# Patient Record
Sex: Female | Born: 1970
Health system: Southern US, Community
[De-identification: ages and names within clinical notes are randomized; demographics above are authoritative.]

## PROBLEM LIST (undated history)

## (undated) DIAGNOSIS — M7752 Other enthesopathy of left foot: Secondary | ICD-10-CM

## (undated) DIAGNOSIS — N946 Dysmenorrhea, unspecified: Secondary | ICD-10-CM

## (undated) DIAGNOSIS — Z8679 Personal history of other diseases of the circulatory system: Secondary | ICD-10-CM

## (undated) DIAGNOSIS — H15009 Unspecified scleritis, unspecified eye: Secondary | ICD-10-CM

## (undated) DIAGNOSIS — D219 Benign neoplasm of connective and other soft tissue, unspecified: Secondary | ICD-10-CM

## (undated) DIAGNOSIS — T7840XA Allergy, unspecified, initial encounter: Secondary | ICD-10-CM

## (undated) DIAGNOSIS — H15103 Unspecified episcleritis, bilateral: Secondary | ICD-10-CM

## (undated) HISTORY — DX: Dysmenorrhea, unspecified: N94.6

## (undated) HISTORY — DX: Benign neoplasm of connective and other soft tissue, unspecified: D21.9

## (undated) HISTORY — PX: OTHER SURGICAL HISTORY: SHX169

## (undated) HISTORY — DX: Unspecified scleritis, unspecified eye: H15.009

## (undated) HISTORY — DX: Allergy, unspecified, initial encounter: T78.40XA

## (undated) HISTORY — DX: Other enthesopathy of left foot and ankle: M77.52

## (undated) HISTORY — DX: Personal history of other diseases of the circulatory system: Z86.79

## (undated) HISTORY — DX: Unspecified episcleritis, bilateral: H15.103

---

## 2001-03-28 ENCOUNTER — Encounter: Payer: Self-pay | Admitting: Emergency Medicine

## 2001-03-28 ENCOUNTER — Emergency Department (HOSPITAL_COMMUNITY): Admission: EM | Admit: 2001-03-28 | Discharge: 2001-03-28 | Payer: Self-pay | Admitting: Emergency Medicine

## 2001-03-29 ENCOUNTER — Ambulatory Visit (HOSPITAL_COMMUNITY): Admission: RE | Admit: 2001-03-29 | Discharge: 2001-03-29 | Payer: Self-pay | Admitting: Orthopedic Surgery

## 2001-03-29 ENCOUNTER — Encounter: Payer: Self-pay | Admitting: Orthopedic Surgery

## 2001-05-06 ENCOUNTER — Ambulatory Visit (HOSPITAL_BASED_OUTPATIENT_CLINIC_OR_DEPARTMENT_OTHER): Admission: RE | Admit: 2001-05-06 | Discharge: 2001-05-06 | Payer: Self-pay | Admitting: Orthopedic Surgery

## 2005-02-14 ENCOUNTER — Other Ambulatory Visit: Admission: RE | Admit: 2005-02-14 | Discharge: 2005-02-14 | Payer: Self-pay | Admitting: Family Medicine

## 2006-08-13 ENCOUNTER — Encounter: Admission: RE | Admit: 2006-08-13 | Discharge: 2006-08-13 | Payer: Self-pay | Admitting: Occupational Medicine

## 2006-12-01 ENCOUNTER — Other Ambulatory Visit: Admission: RE | Admit: 2006-12-01 | Discharge: 2006-12-01 | Payer: Self-pay | Admitting: Family Medicine

## 2010-07-10 ENCOUNTER — Encounter: Admission: RE | Admit: 2010-07-10 | Discharge: 2010-07-10 | Payer: Self-pay | Admitting: Obstetrics and Gynecology

## 2010-08-26 DIAGNOSIS — I82409 Acute embolism and thrombosis of unspecified deep veins of unspecified lower extremity: Secondary | ICD-10-CM

## 2010-08-26 HISTORY — DX: Acute embolism and thrombosis of unspecified deep veins of unspecified lower extremity: I82.409

## 2010-09-17 ENCOUNTER — Encounter: Payer: Self-pay | Admitting: Obstetrics and Gynecology

## 2011-01-11 NOTE — Op Note (Signed)
[place]  Patient:    Joyce Zhang, Joyce Zhang Visit Number: [id number] MRN: [id number]          Service Type: DSU Location: [facility] Attending Physician:  Georgena Spurling Dictated by:   Georgena Spurling, M.D. Proc. Date: 05/06/01 Admit Date:  05/06/2001                             Operative Report  PREOPERATIVE DIAGNOSIS:  Retained hardware, left index finger.  POSTOPERATIVE DIAGNOSIS:  Retained hardware, left index finger.  OPERATION PERFORMED:  Left index finger hardware removal.  SURGEON:  Georgena Spurling, M.D.  ANESTHESIA:  Mask general.  INDICATIONS FOR PROCEDURE:  The patient is a 40 year old black female now over five weeks status post closed reduction and percutaneous pinning of the index finger by another surgeon.  She came to me and wanted me to follow up in her care.  X-ray showed radiographic healing and she wished to have it done in the operating room which I felt was appropriate due to the amount of bone pain experienced with hardware removal.  DESCRIPTION OF PROCEDURE:  The patient was placed supine and administered mask general anesthesia.  The left hand was prepped and draped in the usual sterile fashion.  A pair of pliers was used to remove the pins and the wounds were debrided with sponges and rongeurs free of any eschar.  We then curetted the soft tissue holes and then dressed with Xeroform, dressing sponge and sterile Coban.  The patient tolerated the procedure well.  COMPLICATIONS:  None.  DRAINS:  None.  TOURNIQUET TIME:  None. Dictated by:   Georgena Spurling, M.D. Attending Physician:  Georgena Spurling DD:  05/06/01 TD:  05/06/01 Job: [id number]
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
## 2012-11-18 ENCOUNTER — Other Ambulatory Visit: Payer: Self-pay | Admitting: Obstetrics and Gynecology

## 2012-11-18 DIAGNOSIS — R928 Other abnormal and inconclusive findings on diagnostic imaging of breast: Secondary | ICD-10-CM

## 2012-11-30 ENCOUNTER — Other Ambulatory Visit: Payer: Self-pay

## 2012-12-09 ENCOUNTER — Other Ambulatory Visit: Payer: Self-pay

## 2012-12-18 ENCOUNTER — Ambulatory Visit
Admission: RE | Admit: 2012-12-18 | Discharge: 2012-12-18 | Disposition: A | Discharge status: Home / Self Care | Payer: BC Managed Care – PPO | Source: Ambulatory Visit | Attending: Obstetrics and Gynecology | Admitting: Obstetrics and Gynecology

## 2012-12-18 DIAGNOSIS — R928 Other abnormal and inconclusive findings on diagnostic imaging of breast: Secondary | ICD-10-CM

## 2013-12-09 ENCOUNTER — Telehealth: Payer: Self-pay | Admitting: Obstetrics and Gynecology

## 2013-12-09 NOTE — Telephone Encounter (Signed)
Confirmed appointment

## 2013-12-09 NOTE — Telephone Encounter (Signed)
Calling to confirm new patient appt

## 2013-12-15 ENCOUNTER — Telehealth: Payer: Self-pay | Admitting: Obstetrics and Gynecology

## 2013-12-15 ENCOUNTER — Encounter: Payer: Self-pay | Admitting: Obstetrics and Gynecology

## 2013-12-15 NOTE — Telephone Encounter (Signed)
Pt cancel appt for today she thinks she has the flu. RESCHEDULED TO 01/27/14 @ 1:30

## 2013-12-15 NOTE — Telephone Encounter (Signed)
Thank you for the update. Joyce Zhang is an established patient of mine.  No DNKA fee due to illness.

## 2014-01-27 ENCOUNTER — Ambulatory Visit (INDEPENDENT_AMBULATORY_CARE_PROVIDER_SITE_OTHER): Payer: BC Managed Care – PPO | Admitting: Obstetrics and Gynecology

## 2014-01-27 ENCOUNTER — Encounter: Payer: Self-pay | Admitting: Obstetrics and Gynecology

## 2014-01-27 VITALS — BP 112/60 | HR 80 | Ht 66.5 in | Wt 263.4 lb

## 2014-01-27 DIAGNOSIS — Z113 Encounter for screening for infections with a predominantly sexual mode of transmission: Secondary | ICD-10-CM

## 2014-01-27 DIAGNOSIS — Z0000 Encounter for general adult medical examination without abnormal findings: Secondary | ICD-10-CM

## 2014-01-27 DIAGNOSIS — Z01419 Encounter for gynecological examination (general) (routine) without abnormal findings: Secondary | ICD-10-CM

## 2014-01-27 LAB — POCT URINALYSIS DIPSTICK
Bilirubin, UA: NEGATIVE
Blood, UA: NEGATIVE
Glucose, UA: NEGATIVE
Ketones, UA: NEGATIVE
Leukocytes, UA: NEGATIVE
Nitrite, UA: NEGATIVE
Protein, UA: NEGATIVE
Urobilinogen, UA: NEGATIVE
pH, UA: 5

## 2014-01-27 MED ORDER — MEDROXYPROGESTERONE ACETATE 150 MG/ML IM SUSP: 150.0000 mg | INTRAMUSCULAR | Status: DC
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
## 2014-01-27 NOTE — Patient Instructions (Signed)

## 2014-01-28 LAB — STD PANEL
HIV 1&2 Ab, 4th Generation: NONREACTIVE
Hepatitis B Surface Ag: NEGATIVE

## 2014-01-28 LAB — GC/CHLAMYDIA PROBE AMP, URINE
Chlamydia, Swab/Urine, PCR: NEGATIVE
GC Probe Amp, Urine: NEGATIVE

## 2014-01-28 LAB — HEPATITIS C ANTIBODY: HCV Ab: NEGATIVE

## 2014-01-31 LAB — IPS PAP TEST WITH HPV

## 2014-03-09 ENCOUNTER — Other Ambulatory Visit: Payer: Self-pay

## 2014-03-09 DIAGNOSIS — Z1231 Encounter for screening mammogram for malignant neoplasm of breast: Secondary | ICD-10-CM

## 2014-03-16 ENCOUNTER — Ambulatory Visit
Admission: RE | Admit: 2014-03-16 | Discharge: 2014-03-16 | Disposition: A | Discharge status: Home / Self Care | Payer: BC Managed Care – PPO | Source: Ambulatory Visit

## 2014-03-16 DIAGNOSIS — Z1231 Encounter for screening mammogram for malignant neoplasm of breast: Secondary | ICD-10-CM

## 2014-04-06 ENCOUNTER — Encounter: Payer: Self-pay | Admitting: Obstetrics and Gynecology

## 2014-06-10 ENCOUNTER — Other Ambulatory Visit: Payer: Self-pay

## 2014-07-12 ENCOUNTER — Telehealth: Payer: Self-pay | Admitting: Obstetrics and Gynecology

## 2014-07-12 NOTE — Telephone Encounter (Signed)
Left message regarding upcoming appointment 02/08/15 has been canceled and needs to be rescheduled.

## 2015-02-08 ENCOUNTER — Ambulatory Visit: Payer: BC Managed Care – PPO | Admitting: Obstetrics and Gynecology

## 2015-02-10 ENCOUNTER — Ambulatory Visit: Payer: BC Managed Care – PPO | Admitting: Obstetrics and Gynecology

## 2015-02-22 ENCOUNTER — Ambulatory Visit: Payer: Self-pay | Admitting: Obstetrics and Gynecology

## 2015-03-28 ENCOUNTER — Other Ambulatory Visit: Payer: Self-pay | Admitting: Obstetrics and Gynecology

## 2015-03-28 NOTE — Telephone Encounter (Signed)
Medication refill request: Depo Provera 150 mg  Last AEX:  01/27/14 with BS Next AEX: 05/11/15 with BS Last MMG (if hormonal medication request): 03/17/14 breast density category b ; bi-rads 1: negative  Refill authorized: #1?  (Routed to Dr. Sabra Heck since Dr. Quincy Simmonds is out of the office today)

## 2015-05-11 ENCOUNTER — Telehealth: Payer: Self-pay | Admitting: Obstetrics and Gynecology

## 2015-05-11 ENCOUNTER — Ambulatory Visit: Payer: BLUE CROSS/BLUE SHIELD | Admitting: Obstetrics and Gynecology

## 2015-05-11 NOTE — Telephone Encounter (Signed)
This patient cancelled her AEX today and rescheduled to tomorrow, 05/12/15. Separate staff message to Dr. Quincy Simmonds.

## 2015-05-11 NOTE — Telephone Encounter (Signed)
Thank you for the message

## 2015-05-12 ENCOUNTER — Ambulatory Visit: Payer: BLUE CROSS/BLUE SHIELD | Admitting: Obstetrics and Gynecology

## 2015-05-12 ENCOUNTER — Encounter: Payer: Self-pay | Admitting: Obstetrics and Gynecology

## 2015-05-12 NOTE — Telephone Encounter (Signed)
Encounter closed

## 2015-05-12 NOTE — Telephone Encounter (Signed)
Patient called to reschedule her appointment for today due to work.

## 2015-05-17 ENCOUNTER — Encounter: Payer: Self-pay | Admitting: Obstetrics and Gynecology

## 2015-05-17 ENCOUNTER — Ambulatory Visit (INDEPENDENT_AMBULATORY_CARE_PROVIDER_SITE_OTHER): Payer: BLUE CROSS/BLUE SHIELD | Admitting: Obstetrics and Gynecology

## 2015-05-17 VITALS — BP 138/82 | HR 20 | Resp 20 | Ht 67.75 in | Wt 266.4 lb

## 2015-05-17 DIAGNOSIS — Z01419 Encounter for gynecological examination (general) (routine) without abnormal findings: Secondary | ICD-10-CM

## 2015-05-17 DIAGNOSIS — Z113 Encounter for screening for infections with a predominantly sexual mode of transmission: Secondary | ICD-10-CM

## 2015-05-17 DIAGNOSIS — R635 Abnormal weight gain: Secondary | ICD-10-CM | POA: Diagnosis not present

## 2015-05-17 NOTE — Patient Instructions (Signed)

## 2015-05-17 NOTE — Progress Notes (Signed)
Patient ID: Joyce Zhang, female   DOB: November 27, 1970, 44 y.o.   MRN: [id number] 44 y.o. G0P0 Single African American female here for annual exam.   LMP 03/18/15.  Just over bronchitis and laryngitis.  Seeing an allergist for allergy symptoms.   Patient is receiving her Depo Provera through her girlfriend who is a Equities trader. Will now due them here.  Broke up with girlfriend.  Next one is due in November 2016.   Doing routine labs with PCP.  Wants STD testing.  Really wants a pap and her ex-girlfriend has HPV.   Notes elevated blood pressure.  Not on meds.  Gained 30 pounds.  Not working out.   Finishing her second master's degree.  Plans for return to the [place] area.   PCP:   Precious Haws, MD  No LMP recorded. Patient has had an injection.          Sexually active: Yes.  female partner  The current method of family planning is Depo-Provera injections.(Same Sex Partner)  Exercising: No.   Smoker:  Yes, smokes 1/2 pack cigarettes/day  Health Maintenance: Pap:  01-27-14 Neg:Neg HR HPV History of abnormal Pap:  no MMG:  03-17-14 Density Cat.B/Neg: [facility].   Colonoscopy:  n/a BMD:   n/a  Result  n/a TDaP:  2012 Screening Labs:  Hb today: PCP, Urine today: unable to void  Patient unable to locate exact date of last Depo Provera injection but she knows it was in August.  We estimated 04-10-15, so next injection due 06-26-15 through 07-10-15.  Patient to make appointment with our office for injection.   reports that she has been smoking Cigarettes.  She has been smoking about 0.50 packs per day. She does not have any smokeless tobacco history on file. She reports that she drinks about 3.0 oz of alcohol per week. She reports that she does not use illicit drugs.  Past Medical History  Diagnosis Date  . Fibroid   . Dysmenorrhea   . H/O vasculitis     in legs  . Episcleritis of both eyes     Past Surgical History  Procedure Laterality Date  . Broken foot  Right   . Fracture of index finger Left     Current Outpatient Prescriptions  Medication Sig Dispense Refill  . albuterol (PROAIR HFA) 108 (90 BASE) MCG/ACT inhaler Inhale 1 puff into the lungs as needed.    . budesonide (RHINOCORT ALLERGY) 32 MCG/ACT nasal spray Place 1 spray into both nostrils as needed for rhinitis.    Marland Kitchen levocetirizine (XYZAL) 5 MG tablet Take 1 tablet by mouth daily.  4  . medroxyPROGESTERone (DEPO-PROVERA) 150 MG/ML injection INJECT 1ML INTO THE MUSCLE EVERY 3 MONTHS 1 mL 0   No current facility-administered medications for this visit.    Family History  Problem Relation Age of Onset  . Adopted: Yes  . Diabetes Mother   . Asthma Mother   . Diabetes Maternal Grandmother   . Asthma Maternal Grandmother   . Diabetes Maternal Grandfather     ROS:  Pertinent items are noted in HPI.  Otherwise, a comprehensive ROS was negative.  Exam:   BP 138/82 mmHg  Pulse 20  Resp 20  Ht 5' 7.75" (1.721 m)  Wt 266 lb 6.4 oz (120.838 kg)  BMI 40.80 kg/m2    General appearance: alert, cooperative and appears stated age Head: Normocephalic, without obvious abnormality, atraumatic Neck: no adenopathy, supple, symmetrical, trachea midline and thyroid normal to inspection  and palpation Lungs: clear to auscultation bilaterally Breasts: normal appearance, no masses or tenderness, Inspection negative, No nipple retraction or dimpling, No nipple discharge or bleeding, No axillary or supraclavicular adenopathy Heart: regular rate and rhythm Abdomen: soft, non-tender; bowel sounds normal; no masses,  no organomegaly Extremities: extremities normal, atraumatic, no cyanosis or edema Skin: Skin color, texture, turgor normal. No rashes or lesions Lymph nodes: Cervical, supraclavicular, and axillary nodes normal. No abnormal inguinal nodes palpated Neurologic: Grossly normal  Pelvic: External genitalia:  no lesions              Urethra:  normal appearing urethra with no masses,  tenderness or lesions              Bartholins and Skenes: normal                 Vagina: normal appearing vagina with normal color and discharge, no lesions              Cervix: no lesions              Pap taken:  Yes.  Requested. Bimanual Exam:  Uterus:  normal size, contour, position, consistency, mobility, non-tender              Adnexa: normal adnexa and no mass, fullness, tenderness              Rectovaginal: Yes.  .  Confirms.              Anus:  normal sphincter tone, no lesions  Chaperone was present for exam.  Assessment:   Well woman visit with normal exam. Depo Provera patient.  Desire for STD testing.  Weight gain and increase in blood pressure.  Smoker.  Declines cessation.   Plan: Yearly mammogram recommended after age 65.  Patient will schedule this appointment.  Recommended self breast exam.  Pap and HR HPV as above. Discussed Calcium, Vitamin D, regular exercise program including cardiovascular and weight bearing exercise. Counseled regarding benefits of weight loss.  Lower risk of HTN, cardiovascular disease, diabetes.  Counseled regarding smoking cessation benefits.  Just not ready to quit. Labs performed.  Yes.  .   See orders.  STD testing.  Refills given on medications.  Yes.   OK for Depo Provera 150 mg IM q 3 months for one year.  Patient will provide [place] with date of last injection so appointment can be make for 12 weeks from that time by Depo Protocol.  I am not concerned that she needs to be on her cycle or needs UPT prior to injections as she does not have female partners. Follow up annually and prn.     After visit summary provided.

## 2015-05-18 LAB — STD PANEL
HIV 1&2 Ab, 4th Generation: NONREACTIVE
Hepatitis B Surface Ag: NEGATIVE

## 2015-05-18 LAB — HEPATITIS C ANTIBODY: HCV Ab: NEGATIVE

## 2015-05-19 LAB — IPS N GONORRHOEA AND CHLAMYDIA BY PCR

## 2015-05-19 LAB — IPS PAP TEST WITH HPV

## 2015-06-21 ENCOUNTER — Telehealth: Payer: Self-pay | Admitting: Obstetrics and Gynecology

## 2015-06-21 NOTE — Telephone Encounter (Signed)
Called patient and left messages to call back to reschedule her cancelled appointment for her depo provera on 06/26/15.

## 2015-06-26 ENCOUNTER — Ambulatory Visit: Payer: BLUE CROSS/BLUE SHIELD

## 2015-06-26 ENCOUNTER — Ambulatory Visit (INDEPENDENT_AMBULATORY_CARE_PROVIDER_SITE_OTHER): Payer: BLUE CROSS/BLUE SHIELD | Admitting: *Deleted

## 2015-06-26 VITALS — BP 128/92 | HR 62 | Resp 18 | Ht 67.75 in | Wt 264.0 lb

## 2015-06-26 DIAGNOSIS — Z3042 Encounter for surveillance of injectable contraceptive: Secondary | ICD-10-CM | POA: Diagnosis not present

## 2015-06-26 MED ORDER — MEDROXYPROGESTERONE ACETATE 150 MG/ML IM SUSP
150.0000 mg | Freq: Once | INTRAMUSCULAR | Status: AC
  Administered 2015-06-26: 150 mg via INTRAMUSCULAR

## 2015-06-26 NOTE — Progress Notes (Signed)
Encounter reviewed by Dr. Aundria Rud.  Needs mammogram to be up to date for further Depo Provera injections.

## 2015-06-26 NOTE — Progress Notes (Signed)
Patient is here for Depo Provera Injection Patient is within Depo Provera Calender Limits : restart depo  Next Depo Due between: 1/16-1/30 Last AEX: 05/17/15 Dr. Leretha Dykes Scheduled: 06/05/16 Dr. Quincy Simmonds  Patient is aware when next depo is due.  "OK for Depo Provera 150 mg IM q 3 months for one year. Patient will provide [place] with date of last injection so appointment can be make for 12 weeks from that time by Depo Protocol.  I am not concerned that she needs to be on her cycle or needs UPT prior to injections as she does not have female partners" - 05/17/15 by Dr. Quincy Simmonds  Pt tolerated Injection well.  Routed to provider for review, encounter closed.

## 2015-06-27 ENCOUNTER — Telehealth: Payer: Self-pay

## 2015-06-27 NOTE — Telephone Encounter (Signed)
Left message to call Kaitlyn at [phone]. 

## 2015-06-27 NOTE — Telephone Encounter (Signed)
-----   Message from Nunzio Cobbs, MD sent at 06/26/2015 11:40 AM EDT ----- Regarding: please contact patient and remind her of her need for mammogram  Please inform patient she needs to have her mammogram done if not completed this year.   She will need this in order to receive further Depo Provera injections.   Thanks.  [place].

## 2015-07-04 NOTE — Telephone Encounter (Signed)
Left message to call Kaitlyn at [phone]. 

## 2015-07-11 ENCOUNTER — Other Ambulatory Visit: Payer: Self-pay

## 2015-07-11 DIAGNOSIS — Z1231 Encounter for screening mammogram for malignant neoplasm of breast: Secondary | ICD-10-CM

## 2015-07-11 NOTE — Telephone Encounter (Signed)
Spoke with patient. Advised of message as seen below from [place]. Patient is agreeable and verbalizes understanding. Will call to schedule mammogram appointment prior to next depo injection which is scheduled on 09/11/2015. Aware she must have this done to continue with Depo.  Routing to provider for final review. Patient agreeable to disposition. Will close encounter.

## 2015-07-31 ENCOUNTER — Ambulatory Visit
Admission: RE | Admit: 2015-07-31 | Discharge: 2015-07-31 | Disposition: A | Discharge status: Home / Self Care | Payer: BLUE CROSS/BLUE SHIELD | Source: Ambulatory Visit

## 2015-07-31 DIAGNOSIS — Z1231 Encounter for screening mammogram for malignant neoplasm of breast: Secondary | ICD-10-CM

## 2015-09-11 ENCOUNTER — Ambulatory Visit: Payer: BLUE CROSS/BLUE SHIELD

## 2015-09-12 ENCOUNTER — Ambulatory Visit (INDEPENDENT_AMBULATORY_CARE_PROVIDER_SITE_OTHER): Payer: Managed Care, Other (non HMO)

## 2015-09-12 VITALS — BP 122/70 | HR 70 | Resp 20 | Wt 270.0 lb

## 2015-09-12 DIAGNOSIS — Z308 Encounter for other contraceptive management: Secondary | ICD-10-CM | POA: Diagnosis not present

## 2015-09-12 MED ORDER — MEDROXYPROGESTERONE ACETATE 150 MG/ML IM SUSP
150.0000 mg | Freq: Once | INTRAMUSCULAR | Status: AC
  Administered 2015-09-12: 150 mg via INTRAMUSCULAR

## 2015-09-12 NOTE — Progress Notes (Signed)
Patient is here for Depo Provera Injection Patient is within Depo Provera Calender Limits : yes Next Depo Due between: 04/04-04/18/17 Last AEX: 05/17/15 Dr. Quincy Simmonds AEX Scheduled: 06/05/16 Dr. Quincy Simmonds Last MMG done 07/31/15 BIRADS Category 1 Negative  Patient is aware when next depo is due. Pt tolerated Injection well on Right gluteus.  Routed to provider for review, encounter closed.

## 2015-09-26 ENCOUNTER — Telehealth: Payer: Self-pay | Admitting: Obstetrics and Gynecology

## 2015-09-26 DIAGNOSIS — N926 Irregular menstruation, unspecified: Secondary | ICD-10-CM

## 2015-09-26 DIAGNOSIS — D259 Leiomyoma of uterus, unspecified: Secondary | ICD-10-CM

## 2015-09-26 NOTE — Telephone Encounter (Signed)
Spoke with patient. Patient restarted her Depo Provera injections on 06/26/2015. Most recent injection was given on 09/12/2015. She reports that over the last two weeks she has been experiencing spotting. Today she started having bleeding "like my cycle. I had to put a tampon in." Reports bleeding today is heavier than usual. Denies any feeling of fatigue, SOB, or dizziness. Advised irregular bleeding can occur with the Depo Provera injections. She states she has not had any irregular bleeding until her second injection was performed. "I have never had bleeding for two weeks." Advised I will speak with the covering provider regarding recommendations and return call.  Routing to [place] for review and advise as Dr.Silva is out of the office today. Cc: Dr.Silva

## 2015-09-26 NOTE — Telephone Encounter (Signed)
Spoke with patient. Advised of message as seen below from [place]. She is agreeable. Appointment scheduled for PUS on 09/28/2015 at 10:30 am with 11 am consult with Dr.Silva. Order placed for precert.   [id number] [place]  Routing to provider for final review. Patient agreeable to disposition. Will close encounter.

## 2015-09-26 NOTE — Telephone Encounter (Signed)
Patient got her Depo 09/12/15 patient says she has been spotting for the last 2 weeks and sometimes the bleeding can be significant. Best # to reach: [phone] (patietn lm on our voicemail during lunch)

## 2015-09-26 NOTE — Telephone Encounter (Signed)
Please schedule an ultrasound appointment with me for our office.  Patient has a history of a uterine fibroid. I don't think she has had an ultrasound in a long time.  Please send for precert after discussing with patient.   Thanks.

## 2015-09-27 ENCOUNTER — Telehealth: Payer: Self-pay | Admitting: Obstetrics and Gynecology

## 2015-09-27 NOTE — Telephone Encounter (Signed)
Spoke with pt regarding benefit for ultrasound. Patient understood and agreeable. Patient is scheduled 09/28/15 with Dr Quincy Simmonds. Pt aware of arrival date and time. Pt aware of 72 hours cancellation policy with [id number] fee. No further questions. Ok to close

## 2015-09-28 ENCOUNTER — Encounter: Payer: Self-pay | Admitting: Obstetrics and Gynecology

## 2015-09-28 ENCOUNTER — Ambulatory Visit (INDEPENDENT_AMBULATORY_CARE_PROVIDER_SITE_OTHER): Payer: Managed Care, Other (non HMO)

## 2015-09-28 ENCOUNTER — Ambulatory Visit (INDEPENDENT_AMBULATORY_CARE_PROVIDER_SITE_OTHER): Payer: Managed Care, Other (non HMO) | Admitting: Obstetrics and Gynecology

## 2015-09-28 VITALS — BP 142/82 | HR 70 | Ht 67.75 in | Wt 266.0 lb

## 2015-09-28 DIAGNOSIS — D259 Leiomyoma of uterus, unspecified: Secondary | ICD-10-CM

## 2015-09-28 DIAGNOSIS — N926 Irregular menstruation, unspecified: Secondary | ICD-10-CM

## 2015-09-28 DIAGNOSIS — N921 Excessive and frequent menstruation with irregular cycle: Secondary | ICD-10-CM | POA: Diagnosis not present

## 2015-09-28 NOTE — Progress Notes (Signed)
Subjective  45 y.o. G0P0 African American female here for pelvic ultrasound for abnormal bleeding with Depo Provera.  Last Depo Provera 09/12/15, which was on time. Bleeding for about 2 weeks off and on starting on 09/11/15.  This is not usual for her.  If Depo Provera is late, she spots.  History of uterine fibroid in left fundal region per patient.  Last ultrasound was at outside office in 2007 or 2008.   Patient chooses female partners.   Objective  Pelvic ultrasound images and report reviewed with patient.  Uterus - 3 fibroids - intramural and subserosal.  See size below.  Largest fibroid is 2.27 cm and is left cornual.  EMS - 3.84 mm. Ovaries - normal with follicular activity.  Free fluid - yes, around left ovary.       Assessment  Fibroids.  Irregular bleeding on Depo Provera.  Bleeding may be normal on Depo Provera, may be caused by atrophy, or may be due to fibroids.  I do not believe patient has endometrial pathology or need for EMB.  Plan  Discussion of fibroids and irregular bleeding with Depo Provera.  Plan for moving up the date of the Depo Provera injections to every 11 weeks instead of every 12 weeks.  If bleeding persists now, patient will call back.  I would then give her a two week course of oral estrogen such as Estrace 1 mg daily for 2 weeks.  Follow up for annual exams and prn.   _____15__ minutes face to face time of which over 50% was spent in counseling.   After visit summary to patient.

## 2015-10-18 ENCOUNTER — Telehealth: Payer: Self-pay | Admitting: Obstetrics and Gynecology

## 2015-10-18 MED ORDER — ESTRADIOL 1 MG PO TABS: 1.0000 mg | ORAL_TABLET | Freq: Every day | ORAL | Status: DC

## 2015-10-18 NOTE — Telephone Encounter (Signed)
Patient is still having bleeding after she's had Depo Provera Shot. Best # to reach: [phone]

## 2015-10-18 NOTE — Telephone Encounter (Signed)
Spoke with patient. Advised of message as seen below from [place]. She is agreeable and verbalizes understanding. Rx for Estrace 1 mg take 1 tablet po daily #14 0RF sent to pharmacy on file. She is agreeable. Will return call if bleeding does not improve while taking Estrace.  Routing to provider for final review. Patient agreeable to disposition. Will close encounter.

## 2015-10-18 NOTE — Telephone Encounter (Signed)
Spoke with patient. Patient was seen in the office on 09/28/2015 for PUS due to irregular bleeding with Depo Provera. Last Depo injection was given on 09/12/2015 which was on time. The patient's PUS showed 3 fibroids. The largest being 2.27 cm. Patient states that since she was seen in the office she has been experiencing constant persistent bleeding. "It is light, but I am having to wear a tampon every day." Denies any pelvic pain. Per OV note if patient continued to have irregular bleeding Dr.Silva recommends start on Estrace 1 mg tablet daily for 2 weeks. Advised I will speak with Dr.Silva to provide update and return call with further recommendations. She is agreeable.  Dr.Silva, okay for patient to start Estrace 1 mg daily for 2 weeks at this time?

## 2015-10-18 NOTE — Telephone Encounter (Signed)
OK for Estrace 1 mg po q day for 14 days.  Please send to pharmacy of choice. Please have patient call back if the bleeding does not improve.  Thank you!

## 2015-10-31 ENCOUNTER — Telehealth: Payer: Self-pay | Admitting: Obstetrics and Gynecology

## 2015-10-31 NOTE — Telephone Encounter (Signed)
Patient called to report, "I am still having irregular bleeding after taking the medication to stop it."  No paper chart in file.

## 2015-10-31 NOTE — Telephone Encounter (Signed)
Please have her set up an appointment with Dr Quincy Simmonds to be seen so she can discuss options.

## 2015-10-31 NOTE — Telephone Encounter (Signed)
Patient was seen in the office on 09/28/2015 for PUS due to irregular bleeding with Depo Provera. Last Depo injection was given on 09/12/2015 which was on time. The patient's PUS showed 3 fibroids. The largest being 2.27 cm. Patient called in to the office on 10/18/2015 with update on irregular bleeding. She was started on Estrace 1 mg daily for 2 weeks on 10/18/2015 (please see telephone encounter). Patient is returning call to report she completed her last dose of Estrace this morning and has continued to have irregular bleeding. Patient states that she had irregular spotting intermittently while taking the Estrace which began to slow down last week. Reports today her spotting is becoming more frequent. Denies any pelvic discomfort. Advised I will speak with the covering provider regarding symptoms and return call with further recommendations. She is agreeable.  Routing to [place] for review as Dr.Silva is out of the office today. Cc: Dr.Silva

## 2015-11-01 NOTE — Telephone Encounter (Signed)
Left message to call Kaitlyn at [phone]. 

## 2015-11-01 NOTE — Telephone Encounter (Signed)
Patient returning call.

## 2015-11-01 NOTE — Telephone Encounter (Signed)
Spoke with patient. Advised of recommendation as seen below. She is agreeable. Appointment scheduled for tomorrow 11/02/2015 at 1 pm with Dr.Silva. She is agreeable to date and time.  Routing to provider for final review. Patient agreeable to disposition. Will close encounter.

## 2015-11-01 NOTE — Telephone Encounter (Signed)
I agree with an appointment with me.

## 2015-11-02 ENCOUNTER — Encounter: Payer: Self-pay | Admitting: Obstetrics and Gynecology

## 2015-11-02 ENCOUNTER — Ambulatory Visit (INDEPENDENT_AMBULATORY_CARE_PROVIDER_SITE_OTHER): Payer: Managed Care, Other (non HMO) | Admitting: Obstetrics and Gynecology

## 2015-11-02 VITALS — BP 128/80 | HR 82 | Resp 16 | Ht 67.75 in | Wt 266.8 lb

## 2015-11-02 DIAGNOSIS — D259 Leiomyoma of uterus, unspecified: Secondary | ICD-10-CM

## 2015-11-02 DIAGNOSIS — N921 Excessive and frequent menstruation with irregular cycle: Secondary | ICD-10-CM | POA: Diagnosis not present

## 2015-11-02 DIAGNOSIS — N939 Abnormal uterine and vaginal bleeding, unspecified: Secondary | ICD-10-CM | POA: Diagnosis not present

## 2015-11-02 NOTE — Patient Instructions (Signed)

## 2015-11-02 NOTE — Progress Notes (Signed)
GYNECOLOGY  VISIT   HPI: 45 y.o.   Single  African American  female   G0P0 with Patient's last menstrual period was 09/12/2015.   here for heavy bleeding since 09/12/2015. Pt states that there are no breaks and that the color changes from dark brown to dark red.   Took course of oral estrogen, Estrace for 2 weeks and bleeding has not improved.  Bleeding pattern is brown and red spotting.  Tampon change 1 tampon per day if the bleeding is more red.  No dizziness or lightheadedness.  Has fibroids noted on 09/28/15: 3 fibroids - intramural and subserosal. See size below. Largest fibroid is 2.27 cm and is left cornual.   Has allergies and used Symbicort in the past.   Has a physical with PCP soon.   GYNECOLOGIC HISTORY: Patient's last menstrual period was 09/12/2015. Contraception:Depo (Scheduled for 3/28), female partner. Last mammogram: 07-31-15 Category B Birads 1 Neg Last pap smear: 05/17/15 Normal Neg HR HPV        OB History    Gravida Para Term Preterm AB TAB SAB Ectopic Multiple Living   0                  There are no active problems to display for this patient.   Past Medical History  Diagnosis Date  . Fibroid   . Dysmenorrhea   . H/O vasculitis     in legs  . Episcleritis of both eyes     Past Surgical History  Procedure Laterality Date  . Broken foot Right   . Fracture of index finger Left     Current Outpatient Prescriptions  Medication Sig Dispense Refill  . medroxyPROGESTERone (DEPO-PROVERA) 150 MG/ML injection INJECT 1ML INTO THE MUSCLE EVERY 3 MONTHS 1 mL 0  . estradiol (ESTRACE) 1 MG tablet Take 1 tablet (1 mg total) by mouth daily. (Patient not taking: Reported on 11/02/2015) 14 tablet 0   No current facility-administered medications for this visit.     ALLERGIES: Review of patient's allergies indicates no known allergies.  Family History  Problem Relation Age of Onset  . Adopted: Yes  . Diabetes Mother   . Asthma Mother   . Diabetes Maternal  Grandmother   . Asthma Maternal Grandmother   . Diabetes Maternal Grandfather     Social History   Social History  . Marital Status: Single    Spouse Name: N/A  . Number of Children: N/A  . Years of Education: N/A   Occupational History  . Not on file.   Social History Main Topics  . Smoking status: Former Smoker    Types: Cigarettes  . Smokeless tobacco: Not on file     Comment: 3-5 cigarettes/night  . Alcohol Use: 3.0 oz/week    5 Standard drinks or equivalent per week     Comment: socially  . Drug Use: No  . Sexual Activity:    Partners: Female    Museum/gallery curator: Injection, None     Comment: on Depo Provera injections   Other Topics Concern  . Not on file   Social History Narrative    ROS:  Pertinent items are noted in HPI.  PHYSICAL EXAMINATION:    BP 128/80 mmHg  Pulse 82  Resp 16  Ht 5' 7.75" (1.721 m)  Wt 266 lb 12.8 oz (121.02 kg)  BMI 40.86 kg/m2  LMP 09/12/2015    General appearance: alert, cooperative and appears stated age  Pelvic: External genitalia:  no lesions  Urethra:  normal appearing urethra with no masses, tenderness or lesions              Bartholins and Skenes: normal                 Vagina: normal appearing vagina with normal color and discharge, no lesions              Cervix: no lesions and small amount dark blood in vagina.              Bimanual Exam:  Uterus:  normal size, contour, position, consistency, mobility, non-tender              Adnexa: normal adnexa and no mass, fullness, tenderness              Endometrial biopsy. Consent for procedure.  Speculum placed in vagina.  Sterile prep with Hibiclens. Paracervical block with 10 cc 1% lidocaine.  Lot [id number], exp 10/24/16 Tenaculum to anterior cx lip. Os finder used.  Pipelle passed to 7 cm twice.  Tissue to pathology.  Tolerated well.  No complications.  Minimal EBL.  Chaperone was present for exam.  ASSESSMENT  Abnormal bleeding on Depo  Provera.  Uterine fibroids. Smoker.  Allergies.  PLAN  Counseled regarding abnormal uterine bleeding and etiologies - Depo Provera, fibroids, endometritis, polyps, hyperplasia and malignancy.  Follow up EMB. Instructions and precautions given.  Treatment options reviewed - early Depo Provera injection at 2 months instead of 3 months, Depo Lupron, hysteroscopy with dilation and curettage and resection of endometrial mass +/- endometrial ablation, uterine artery embolization, hysterectomy.  Patient prefers the least invasive option.  Will likely proceed with an early Depo Provera injection at the 8 week mark instead of 12 weeks between injections.  If bleeding persists, I would proceed with a sonohysterogram.     An After Visit Summary was printed and given to the patient.  ___15___ minutes face to face time of which over 50% was spent in counseling.

## 2015-11-09 ENCOUNTER — Telehealth: Payer: Self-pay

## 2015-11-09 DIAGNOSIS — D259 Leiomyoma of uterus, unspecified: Secondary | ICD-10-CM

## 2015-11-09 NOTE — Telephone Encounter (Signed)
Spoke with patient. Advised of message and results as seen below from [place]. She is agreeable and verbalizes understanding. Would like to schedule SHGM at this time. SHGM scheduled for 11/16/2015 at 10:30 am with 11 am consult with Dr.Silva. She is agreeable to date and time. Order for [facility] placed for precert.  Cc: Lerry Liner for precert  Routing to provider for final review. Patient agreeable to disposition. Will close encounter.

## 2015-11-09 NOTE — Telephone Encounter (Signed)
-----   Message from Nunzio Cobbs, MD sent at 11/08/2015 11:07 PM EDT ----- Please report EMB results showing a benign endometrial polyp.  Standard of care is to remove symptomatic polyps through hysteroscopy with dilation and curettage.  I am recommending patient return for sonohysterogram to evaluate the fibroid she has in the cornua (corner) of her uterus.  If this is a really a submucous fibroid (protruding into the cavity of the uterus), then this can be removed at the same time as the polyp.    Cc- Marisa Sprinkles

## 2015-11-10 ENCOUNTER — Telehealth: Payer: Self-pay | Admitting: Obstetrics and Gynecology

## 2015-11-10 NOTE — Telephone Encounter (Signed)
Spoke with pt regarding benefit for sonohysterogram. Patient understood and agreeable. Patient ready to schedule. Patient scheduled 11/16/15 with Dr Quincy Simmonds. Pt aware of arrival date and time. Pt aware of 72 hours cancellation policy with [id number] fee. No further questions. Ok to close

## 2015-11-16 ENCOUNTER — Encounter: Payer: Self-pay | Admitting: Obstetrics and Gynecology

## 2015-11-16 ENCOUNTER — Ambulatory Visit (INDEPENDENT_AMBULATORY_CARE_PROVIDER_SITE_OTHER): Payer: Managed Care, Other (non HMO)

## 2015-11-16 ENCOUNTER — Ambulatory Visit (INDEPENDENT_AMBULATORY_CARE_PROVIDER_SITE_OTHER): Payer: Managed Care, Other (non HMO) | Admitting: Obstetrics and Gynecology

## 2015-11-16 VITALS — BP 124/80 | HR 88 | Ht 67.75 in | Wt 264.0 lb

## 2015-11-16 DIAGNOSIS — Z803 Family history of malignant neoplasm of breast: Secondary | ICD-10-CM | POA: Diagnosis not present

## 2015-11-16 DIAGNOSIS — N939 Abnormal uterine and vaginal bleeding, unspecified: Secondary | ICD-10-CM | POA: Diagnosis not present

## 2015-11-16 DIAGNOSIS — D259 Leiomyoma of uterus, unspecified: Secondary | ICD-10-CM

## 2015-11-16 NOTE — Progress Notes (Addendum)
Subjective  45 y.o. G0P0 Single African American female here for pelvic ultrasound for breakthrough bleeding on Depo Provera.   Female companion here for the ultrasound and discussion portion of the visit.   Took course of oral estrogen, Estrace for 2 weeks and bleeding did not improve. This prompted ultrasound and endometrial biopsy.  Ultrasound 09/28/15: 3 fibroids - intramural and subserosal. See size below. Largest fibroid is 2.27 cm and is left cornual.  EMB 11/16/15 showing benign endometrial polyp with progestational effect.   Bleeding now is almost done.   Clear for the last 4 - 5 days.  Patient is a smoker.   Same sex partner.   Patient's last menstrual period was 11/06/2015 (exact date).   Patient is adopted but has recently been able to obtain some family history. FH - paternal grandmother and aunt - uterine tumors removed.  Uncertain if cancer or not. Maternal grandmother and mother with history of breast cancer.  No family hx of ovarian cancer.   Objective  Technique:  Both transabdominal and transvaginal ultrasound examinations of the pelvis were performed. Transabdominal technique was performed for global imaging of the pelvis including uterus, ovaries, adnexal regions, and pelvic cul-de-sac. It was necessary to proceed with endovaginal exam following the abdominal ultrasound.  Transabdominal exam to visualize the endometrium and adnexa.  Color and duplex Doppler ultrasound was utilized to evaluate blood flow to the ovaries.   Pelvic ultrasound images and report reviewed with patient.  Uterus - fibroids noted and no change.  EMS - 1.97 Ovaries - normal.  Free fluid - no  Procedure - sonohysterogram Consent performed. Speculum placed in vagina. Sterile prep of cervix with  Hibiclens. Cannula placed inside endometrial cavity without difficulty. Speculum removed. Sterile saline injected.    No        filling defect noted. Cannula removed. No complication.    Patient given Motrin following procedure.     Assessment  Abnormal uterine bleeding on Depo Provera. Fibroids.  FH of breast cancer.   Plan  Discussion of fibroids and benign endometrial polyp.  Reassurance regarding the saline ultrasound and no findings of endometrial masses, no submucous component to fibroids.  Discussion regarding options for care:  Continue Depo Provera every 3 months, give Depo Provera every 2 months if needed to control bleeding, stop Depo Provera all together, Mirena IUD, Nexplanon, endometrial ablation, uterine artery embolization, hysterectomy with bilateral salpingectomy/+/- bilateral oophorectomy depending on potential genetic status.  Patient will continue with Depo Provera every 3 mo, and has appt for next week.  Discussed potential genetic counseling and testing for FH of breast cancer.  She will consider and try to get additional FH.   __15_____ minutes face to face time of which over 50% was spent in counseling.   After visit summary to patient.

## 2015-11-21 ENCOUNTER — Ambulatory Visit: Payer: Managed Care, Other (non HMO)

## 2015-11-23 ENCOUNTER — Ambulatory Visit (INDEPENDENT_AMBULATORY_CARE_PROVIDER_SITE_OTHER): Payer: Managed Care, Other (non HMO) | Admitting: *Deleted

## 2015-11-23 VITALS — BP 120/74 | HR 80 | Ht 67.75 in | Wt 265.0 lb

## 2015-11-23 DIAGNOSIS — N939 Abnormal uterine and vaginal bleeding, unspecified: Secondary | ICD-10-CM

## 2015-11-23 MED ORDER — MEDROXYPROGESTERONE ACETATE 150 MG/ML IM SUSP
150.0000 mg | Freq: Once | INTRAMUSCULAR | Status: AC
  Administered 2015-11-23: 150 mg via INTRAMUSCULAR

## 2015-11-23 NOTE — Patient Instructions (Signed)
Please return between 02/08/16 and 02/22/16 for next Depo Provera injection.

## 2015-11-23 NOTE — Progress Notes (Signed)
Patient ID: Joyce Zhang, female   DOB: 05/15/1971, 45 y.o.   MRN: [id number]  Pt arrived for Depo Provera injection.  Pt tolerated injection well in left gluteal. Last AEX - 05/17/15 Next AEX - 06/05/16 Last MMG - 07/31/15, Bi-Rads 1:  Negative, screening in one year Last Depo Provera Given - 09/12/15 Pt is within due dates. Pt should return between 02/08/16 and 02/22/16

## 2015-11-28 ENCOUNTER — Ambulatory Visit: Payer: Managed Care, Other (non HMO)

## 2016-02-08 ENCOUNTER — Telehealth: Payer: Self-pay | Admitting: Obstetrics and Gynecology

## 2016-02-08 ENCOUNTER — Telehealth: Payer: Self-pay

## 2016-02-08 ENCOUNTER — Ambulatory Visit (INDEPENDENT_AMBULATORY_CARE_PROVIDER_SITE_OTHER): Payer: Managed Care, Other (non HMO)

## 2016-02-08 ENCOUNTER — Ambulatory Visit: Payer: Managed Care, Other (non HMO)

## 2016-02-08 VITALS — BP 118/64 | HR 80 | Resp 16 | Wt 259.0 lb

## 2016-02-08 DIAGNOSIS — Z3042 Encounter for surveillance of injectable contraceptive: Secondary | ICD-10-CM

## 2016-02-08 MED ORDER — MEDROXYPROGESTERONE ACETATE 150 MG/ML IM SUSP
150.0000 mg | Freq: Once | INTRAMUSCULAR | Status: AC
  Administered 2016-02-08: 150 mg via INTRAMUSCULAR

## 2016-02-08 NOTE — Telephone Encounter (Signed)
Patient called back and decided to keep the Depo appointment today. No need to call patient.

## 2016-02-08 NOTE — Telephone Encounter (Signed)
Routing to Dr.Silva as FYI. Will close encounter.

## 2016-02-08 NOTE — Telephone Encounter (Signed)
Patient here in office for Depo Provera injection. During visit, patient stated that she would like to start receiving injection through prescription at the pharmacy so that her significant other who is a Equities trader can administer injection to her. Patient states that co-pay to come here in office for injection administration is $60 and this is beginning to become too much, as she has to come in for injection about 4 times a year. Please advise on what can be done. Thank you.

## 2016-02-08 NOTE — Telephone Encounter (Signed)
Patient canceled her Depo appointment today. Patient is asking if the Depo could be called to her pharmacy instead. Patient's friend who is a Marine scientist used to administer her Depo. Confirmed pharmacy with patient.

## 2016-02-08 NOTE — Telephone Encounter (Signed)
Patient has done this in the past.  I will Ok this if she calls or sends a my chart message with the date of the injection each time, so we can put it in her chart.

## 2016-02-08 NOTE — Progress Notes (Signed)
Patient is here for Depo Provera Injection Patient is within Depo Provera Calender Limits 02/08/16 and 02/22/16 Next Depo Due between: 8/30//17 - 05/09/16 Last AEX: 05/17/15 AEX Scheduled: 06/05/16 MMG: 07/31/15 BIRADS1 negative Patient is aware when next depo is due  Pt tolerated Injection well in Right Upper Outer Quadrant.  Routed to provider for review, encounter closed.

## 2016-02-09 NOTE — Telephone Encounter (Signed)
Patient has been notified of the following message and agrees. Routing to provider for final review then closing encounter.

## 2016-02-09 NOTE — Telephone Encounter (Signed)
I have closed the encounter. 

## 2016-04-24 ENCOUNTER — Telehealth: Payer: Self-pay | Admitting: Obstetrics and Gynecology

## 2016-04-24 MED ORDER — MEDROXYPROGESTERONE ACETATE 150 MG/ML IM SUSP: 150.0000 mg | Freq: Once | INTRAMUSCULAR | 0 refills | Status: DC

## 2016-04-24 NOTE — Telephone Encounter (Signed)
Message left on patient voicemail that RX for Depo Provera has been sent to pharmacy.

## 2016-04-24 NOTE — Telephone Encounter (Signed)
Patient called and left a message on the voicemail at lunch. She requested a prescription for depo provera be sent to her pharmacy on file to help control her out of pocket costs. She is scheduled for her shot tomorrow at our office.

## 2016-04-24 NOTE — Telephone Encounter (Signed)
Medication refill request: depo provera 150mg  Last AEX:  05-17-15 Next AEX: 06-05-16 Last MMG (if hormonal medication request): n/a Refill authorized: pt would like rx sent to pharmacy due to cost. appt for depo is tomorrow. Please approve.

## 2016-04-24 NOTE — Telephone Encounter (Signed)
OK to have Depo Provera.

## 2016-04-25 ENCOUNTER — Telehealth: Payer: Self-pay | Admitting: Obstetrics and Gynecology

## 2016-04-25 ENCOUNTER — Ambulatory Visit: Payer: Managed Care, Other (non HMO)

## 2016-04-25 NOTE — Telephone Encounter (Signed)
Patient canceled her depo appointment today. Patient did not wish to reschedule today.

## 2016-06-05 ENCOUNTER — Encounter: Payer: Self-pay | Admitting: Obstetrics and Gynecology

## 2016-06-05 ENCOUNTER — Ambulatory Visit (INDEPENDENT_AMBULATORY_CARE_PROVIDER_SITE_OTHER): Payer: Managed Care, Other (non HMO) | Admitting: Obstetrics and Gynecology

## 2016-06-05 VITALS — BP 144/80 | HR 70 | Resp 16 | Ht 66.5 in | Wt 259.0 lb

## 2016-06-05 DIAGNOSIS — D259 Leiomyoma of uterus, unspecified: Secondary | ICD-10-CM | POA: Diagnosis not present

## 2016-06-05 DIAGNOSIS — Z01411 Encounter for gynecological examination (general) (routine) with abnormal findings: Secondary | ICD-10-CM

## 2016-06-05 MED ORDER — MEDROXYPROGESTERONE ACETATE 150 MG/ML IM SUSP: 150.0000 mg | Freq: Once | INTRAMUSCULAR | 3 refills | Status: DC

## 2016-06-05 NOTE — Patient Instructions (Addendum)
EXERCISE AND DIET:  We recommended that you start or continue a regular exercise program for good health. Regular exercise means any activity that makes your heart beat faster and makes you sweat.  We recommend exercising at least 30 minutes per day at least 3 days a week, preferably 4 or 5.  We also recommend a diet low in fat and sugar.  Inactivity, poor dietary choices and obesity can cause diabetes, heart attack, stroke, and kidney damage, among others.    ALCOHOL AND SMOKING:  Women should limit their alcohol intake to no more than 7 drinks/beers/glasses of wine (combined, not each!) per week. Moderation of alcohol intake to this level decreases your risk of breast cancer and liver damage. And of course, no recreational drugs are part of a healthy lifestyle.  And absolutely no smoking or even second hand smoke. Most people know smoking can cause heart and lung diseases, but did you know it also contributes to weakening of your bones? Aging of your skin?  Yellowing of your teeth and nails?  CALCIUM AND VITAMIN D:  Adequate intake of calcium and Vitamin D are recommended.  The recommendations for exact amounts of these supplements seem to change often, but generally speaking 600 mg of calcium (either carbonate or citrate) and 800 units of Vitamin D per day seems prudent. Certain women may benefit from higher intake of Vitamin D.  If you are among these women, your doctor will have told you during your visit.    PAP SMEARS:  Pap smears, to check for cervical cancer or precancers,  have traditionally been done yearly, although recent scientific advances have shown that most women can have pap smears less often.  However, every woman still should have a physical exam from her gynecologist every year. It will include a breast check, inspection of the vulva and vagina to check for abnormal growths or skin changes, a visual exam of the cervix, and then an exam to evaluate the size and shape of the uterus and  ovaries.  And after 44 years of age, a rectal exam is indicated to check for rectal cancers. We will also provide age appropriate advice regarding health maintenance, like when you should have certain vaccines, screening for sexually transmitted diseases, bone density testing, colonoscopy, mammograms, etc.   MAMMOGRAMS:  All women over 52 years old should have a yearly mammogram. Many facilities now offer a "3D" mammogram, which may cost around $50 extra out of pocket. If possible,  we recommend you accept the option to have the 3D mammogram performed.  It both reduces the number of women who will be called back for extra views which then turn out to be normal, and it is better than the routine mammogram at detecting truly abnormal areas.    COLONOSCOPY:  Colonoscopy to screen for colon cancer is recommended for all women at age 18.  We know, you hate the idea of the prep.  We agree, BUT, having colon cancer and not knowing it is worse!!  Colon cancer so often starts as a polyp that can be seen and removed at colonscopy, which can quite literally save your life!  And if your first colonoscopy is normal and you have no family history of colon cancer, most women don't have to have it again for 10 years.  Once every ten years, you can do something that may end up saving your life, right?  We will be happy to help you get it scheduled when you are ready.  Be sure to check your insurance coverage so you understand how much it will cost.  It may be covered as a preventative service at no cost, but you should check your particular policy.     BRCA-1 and BRCA-2 Testing BRCA-1 and BRCA-2 are genes that make proteins that help repair damaged cells. BRCA-1 and BRCA-2 testing is done to see if there is a mutation in either of these genes. If there is a mutation, the genes may not be able to help repair damaged cells. As a result, the cells may develop defects that can lead to certain types of cancer. You may have this  test if you have a family history of certain types of cancer, including cancer of the:  Breast.  Fallopian tubes.  Ovaries.  Peritoneum. The test requires either a sample of blood or a sample of the cells from the inside of your cheek. If a sample of blood is taken, it will be drawn from a vein in your arm using a thin needle. If a sample of cells is taken, you will get instructions on how to use a rinse to collect the sample. RESULTS It is your responsibility to obtain your test results. Ask the lab or the department doing the test when and how you will get the results. Contact your health care provider if you have any questions about your results. The lab test results can show whether:  You do not have a mutation in the BRCA-1 or BRCA-2 gene that increases your risk for certain cancers.  You have a mutation in the BRCA-1 or BRCA-2 gene that increases your risk for certain cancers.  You have a mutation in the BRCA-1 or BRCA-2 gene that has not been found to increase your risk for certain cancers. Meaning of Negative Test Results A negative test result means that you do not have a mutation in the BRCA-1 or BRCA-2 gene that is known to increase your risk for certain cancers. This does not mean you will never get cancer. Talk to your health care provider or genetic counselor about what this result means for you. Meaning of Positive Test Results A positive test result means that you do have a mutation in the BRCA-1 or BRCA-2 gene that increases your risk for certain cancers. Women with a positive test result have an increased risk for ovarian cancer. Both women and men with a mutation have an increased risk for breast cancer and may be at greater risk for other types of cancer. Getting a positive test result does not mean you will develop cancer.  You may be told you are a carrier. This means you can pass the mutation to your children.  Talk to your health care provider or genetic counselor  about what this result means for you. Meaning of Ambiguous Test Results Ambiguous, inconclusive, or uncertain test results mean there is a change in the BRCA-1 or BRCA-2 gene, but this change has not been linked to cancer. Talk to your health care provider or genetic counselor about what this result means for you.   This information is not intended to replace advice given to you by your health care provider. Make sure you discuss any questions you have with your health care provider.   Document Released: 09/05/2004 Document Revised: 09/02/2014 Document Reviewed: 11/11/2013 Elsevier Interactive Patient Education [employer].

## 2016-06-05 NOTE — Progress Notes (Signed)
45 y.o. G0P0 Single African American female here for annual exam.    Last Depo Provera given at home on 04/29/16.  Partner gives her the Depo Provera. No bleeding unless she is due for her Depo Provera.   Hx fibroids.  Hx of prior EMB showing microscopic endometrial polyp.  Nothing seen on sonohysterogram.   Having problems with rash on her skin.  Has dx of vasculitis.  Sees Rheumatology on Nov 9th.   Takes Ca/Vit D twice daily.   No new partner.  Ok to wait until next year for STD testing per patient.  Lab closed.   Labs with PCP.   PCP:   Joyce Zhang   No LMP recorded. Patient has had an injection.           Sexually active: Yes.   Same sex partner  The current method of family planning is Depo Provera Exercising: No.  The patient does not participate in regular exercise at present. Smoker:  yes  Health Maintenance: Pap:  05/17/15 Neg. HR HPV:neg  History of abnormal Pap:  no MMG:  08/01/15 BIRADS1:neg  Colonoscopy:  n/a BMD:   n/a  TDaP:  2012  HIV: 05/17/15 Neg  Hep C: 05/17/15  Neg  Screening Labs:  Hb today: PCP, Urine today: PCP   reports that she has been smoking Cigarettes.  She has been smoking about 0.25 packs per day. She has never used smokeless tobacco. She reports that she drinks about 3.0 oz of alcohol per week . She reports that she does not use drugs.  Past Medical History:  Diagnosis Date  . Dysmenorrhea   . Episcleritis of both eyes   . Fibroid   . H/O vasculitis    in legs    Past Surgical History:  Procedure Laterality Date  . broken foot Right   . fracture of index finger Left     Current Outpatient Prescriptions  Medication Sig Dispense Refill  . ketorolac (ACULAR) 0.4 % SOLN Place 1 drop into both eyes daily as needed.    . medroxyPROGESTERone (DEPO-PROVERA) 150 MG/ML injection Inject 1 mL (150 mg total) into the muscle once. 1 mL 0   No current facility-administered medications for this visit.     Family History  Problem Relation  Age of Onset  . Adopted: Yes  . Diabetes Mother   . Asthma Mother   . Breast cancer Mother   . Diabetes Maternal Grandmother   . Asthma Maternal Grandmother   . Breast cancer Maternal Grandmother   . Diabetes Maternal Grandfather     ROS:  Pertinent items are noted in HPI.  Otherwise, a comprehensive ROS was negative.  Exam:   BP (!) 144/80 (BP Location: Right Arm, Patient Position: Sitting, Cuff Size: Large)   Pulse 70   Resp 16   Ht 5' 6.5" (1.689 m)   Wt 259 lb (117.5 kg)   BMI 41.18 kg/m     General appearance: alert, cooperative and appears stated age Head: Normocephalic, without obvious abnormality, atraumatic Neck: no adenopathy, supple, symmetrical, trachea midline and thyroid normal to inspection and palpation Lungs: clear to auscultation bilaterally Breasts: normal appearance, no masses or tenderness, No nipple retraction or dimpling, No nipple discharge or bleeding, No axillary or supraclavicular adenopathy Heart: regular rate and rhythm Abdomen: soft, non-tender; no masses, no organomegaly Extremities: extremities normal, atraumatic, no cyanosis or edema Skin: Skin color, texture, turgor normal.  Rash on her legs.  Lymph nodes: Cervical, supraclavicular, and axillary nodes normal.  No abnormal inguinal nodes palpated Neurologic: Grossly normal  Pelvic: External genitalia:  no lesions              Urethra:  normal appearing urethra with no masses, tenderness or lesions              Bartholins and Skenes: normal                 Vagina: normal appearing vagina with normal color and discharge, no lesions              Cervix: no lesions              Pap taken: No. Bimanual Exam:  Uterus:  normal size, contour, position, consistency, mobility, non-tender              Adnexa: no mass, fullness, tenderness              Rectal exam: Yes.  .  Confirms.              Anus:  normal sphincter tone, no lesions  Chaperone was present for exam.  Assessment:   Well woman  visit with normal exam. Fibroids.  Depo Provera patient.  FH breast cancer.  Patient adopted and will confirm hx.  Smoker.  Vasculitis.   Plan: Yearly mammogram recommended after age 63.  Recommended self breast exam.  Pap and HR HPV as above. Discussed Calcium, Vitamin D, regular exercise program including cardiovascular and weight bearing exercise. Depo Provera 150 mg IM q 3 months.  Ok for patient to do injections every 12 weeks for one year with her partner who is a Marine scientist. I discussed genetic counseling and tx if patient confirms family history of breast cancer. Smoking cessation program information given.  Declines Wellbutrin.  She will follow up with Rheumatology.   Follow up annually and prn.       After visit summary provided.

## 2016-07-02 DIAGNOSIS — J45909 Unspecified asthma, uncomplicated: Secondary | ICD-10-CM | POA: Insufficient documentation

## 2016-07-02 DIAGNOSIS — J309 Allergic rhinitis, unspecified: Secondary | ICD-10-CM | POA: Insufficient documentation

## 2016-07-02 DIAGNOSIS — Z9119 Patient's noncompliance with other medical treatment and regimen: Secondary | ICD-10-CM | POA: Insufficient documentation

## 2016-07-02 DIAGNOSIS — Z79899 Other long term (current) drug therapy: Secondary | ICD-10-CM | POA: Insufficient documentation

## 2016-07-02 DIAGNOSIS — L959 Vasculitis limited to the skin, unspecified: Secondary | ICD-10-CM | POA: Insufficient documentation

## 2016-07-02 DIAGNOSIS — H15103 Unspecified episcleritis, bilateral: Secondary | ICD-10-CM | POA: Insufficient documentation

## 2016-07-02 DIAGNOSIS — M810 Age-related osteoporosis without current pathological fracture: Secondary | ICD-10-CM | POA: Insufficient documentation

## 2016-07-02 DIAGNOSIS — Z91199 Patient's noncompliance with other medical treatment and regimen due to unspecified reason: Secondary | ICD-10-CM | POA: Insufficient documentation

## 2016-07-02 DIAGNOSIS — I776 Arteritis, unspecified: Secondary | ICD-10-CM | POA: Insufficient documentation

## 2016-07-02 DIAGNOSIS — Z86718 Personal history of other venous thrombosis and embolism: Secondary | ICD-10-CM | POA: Insufficient documentation

## 2016-07-02 DIAGNOSIS — F17200 Nicotine dependence, unspecified, uncomplicated: Secondary | ICD-10-CM | POA: Insufficient documentation

## 2016-07-02 NOTE — Progress Notes (Signed)
*IMAGE* Office Visit Note  Patient: Joyce Zhang             Date of Birth: 02-27-1971           MRN: [id number]             PCP: Boyd,TammyMD Referring: Virl Son MD Visit Date: 07/04/2016 Occupation:Assistant Analyst    Subjective:  Rash   History of Present Illness: Joyce Zhang is a 45 y.o. female seen in consultation per request of Dr. Wynelle Link for evaluation of arthralgia and vasculitis. According to the patient in 2009 she developed inflammation in bilateral eyes. She was seen by an ophthalmologist at [facility] and was diagnosed with scleritis. She was referred to a rheumatologist at [facility]. She states no cause was determined that she was treated with methotrexate 10 mg per week for about a year after that she stopped the medication herself she had recurrence of episcleritis with some rash. She was treated with topical eyedrops. Later in  October 2011 she developed discomfort in her lower extremities and a week later she developed a rash on her lower extremities. She tried over-the-counter Advil for a while. The rash recurred a month later. She was seen by dermatologist who did a skin biopsy which was consistent with vasculitis. She was started on prednisone taper she had total of 3 tapers and after each taper the rash would recur. In February 2012 she was referred to Dr. Nickola Major rheumatologist in [place]. She had extensive lab work which was negative. Then she was placed on prednisone 6 tablets per week. Review of records indicate that there was some compliance issue with the follow-up appointments and medication usage. Her rash was quite well controlled on methotrexate and Episcleritis went into remission. In February 2013 she had a flareup episcleritis and increasing the dose of methotrexate was discussed she was also diagnosed with a DVT and received anticoagulant treatment. Her methotrexate dose was increased to 8 tablets per week. The last note I have is from 12/27/2011 while she  had a flare of her vasculitis and continued with low dose prednisone taper through ophthalmologist and his steroid eyedrops. Patient states that after her visit in May 2013 she changed jobs and due to financial reasons she discontinued methotrexate. She states she has had several flares which she relates and feels were associated with smoking and drinking alcohol. She has reduced the amount of alcohol intake. She is also cut down on her smoking and smoking only 5 cigarettes per day now. She states 2 months ago she went to [place] and developed a rash on her bilateral lower extremities which was quite extensive and made her really concerned to make this appointment today. The rash healed by itself without any need for steroids. She gets 2 episodes of episcleritis per year and the last episode was about one week ago. Her episcleritis's usually responds to topical steroids. She's been seeing ophthalmologist at [facility] now. She did not have to use any prednisone taper in the last 3 years. She denies any shortness of breath or palpitations. Activities of Daily Living:  Patient reports morning stiffness for minute.   Patient Denies nocturnal pain.  Difficulty dressing/grooming: Denies Difficulty climbing stairs: Denies Difficulty getting out of chair: Denies Difficulty using hands for taps, buttons, cutlery, and/or writing: Denies   Review of Systems  Constitutional: Negative for fatigue, night sweats, weight gain, weight loss and weakness.  HENT: Negative for mouth sores, trouble swallowing, trouble swallowing, mouth dryness and nose dryness.  Eyes: Negative for pain, redness, visual disturbance and dryness.  Respiratory: Negative for cough, shortness of breath and difficulty breathing.   Cardiovascular: Negative for chest pain, palpitations, hypertension, irregular heartbeat and swelling in legs/feet.  Gastrointestinal: Negative for blood in stool, constipation and diarrhea.  Endocrine:  Negative for increased urination.  Genitourinary: Negative for vaginal dryness.  Musculoskeletal: Negative for arthralgias, joint pain, joint swelling, myalgias, muscle weakness, morning stiffness, muscle tenderness and myalgias.  Skin: Positive for rash. Negative for color change, hair loss, skin tightness, ulcers and sensitivity to sunlight.  Allergic/Immunologic: Negative for susceptible to infections.  Neurological: Negative for dizziness, memory loss and night sweats.  Hematological: Negative for swollen glands.  Psychiatric/Behavioral: Negative for depressed mood and sleep disturbance. The patient is not nervous/anxious.     PMFS History:  Patient Active Problem List   Diagnosis Date Noted  . Vasculitis on skin biopsy ([place]) 07/02/2016  . Episcleritis of both eyes 07/02/2016  . High risk medication use 07/02/2016  . Asthma 07/02/2016  . Allergic rhinitis 07/02/2016  . Noncompliance 07/02/2016  . History of DVT (deep vein thrombosis) 07/02/2016  . OP (osteoporosis) 07/02/2016  . Smoker 07/02/2016    Past Medical History:  Diagnosis Date  . Dysmenorrhea   . Episcleritis of both eyes   . Fibroid   . H/O vasculitis    in legs    Family History  Problem Relation Age of Onset  . Adopted: Yes  . Diabetes Mother   . Asthma Mother   . Breast cancer Mother   . Diabetes Maternal Grandmother   . Asthma Maternal Grandmother   . Breast cancer Maternal Grandmother   . Diabetes Maternal Grandfather    Past Surgical History:  Procedure Laterality Date  . broken foot Right   . fracture of index finger Left    Social History   Social History Narrative  . No narrative on file     Objective: Vital Signs: BP (!) 153/98 (BP Location: Left Arm, Patient Position: Sitting, Cuff Size: Large)   Pulse 84   Resp 14   Ht '5\' 8"'$  (1.727 m)   Wt 260 lb (117.9 kg)   BMI 39.53 kg/m    Physical Exam  Constitutional: She is oriented to person, place, and time. She appears well-developed  and well-nourished.  HENT:  Head: Normocephalic and atraumatic.  Eyes: Conjunctivae and EOM are normal.  Neck: Normal range of motion.  Cardiovascular: Normal rate, regular rhythm, normal heart sounds and intact distal pulses.   Pulmonary/Chest: Effort normal and breath sounds normal.  Abdominal: Soft. Bowel sounds are normal.  Lymphadenopathy:    She has no cervical adenopathy.  Neurological: She is alert and oriented to person, place, and time.  Skin: Skin is warm and dry. Capillary refill takes less than 2 seconds.  She has hyperpigmented lesions on her bilateral lower extremities and some on her right upper extremity. No active vasculitic lesions were noted.  Psychiatric: She has a normal mood and affect. Her behavior is normal.  Nursing note and vitals reviewed.    Musculoskeletal Exam: C-spine, thoracic, lumbar spine good range of motion with the no SI joint tenderness. She has good range of motion of her shoulders, elbow joints, wrist joints, MCPs PIPs DIPs with no synovitis. She has good range of motion of her hip joints, knee joints, ankle joints, MTPs PIPs DIPs with no synovitis.  CDAI Exam: No CDAI exam completed.    Investigation: Findings:  11/26/2010 record review Dr. Trudie Reed hepatitis, HIV,  ANA, C3, C4, ACBE, CK RF, CCP, HLA-B27, ANCA, CXR all normal    Imaging: No results found.  Speciality Comments: No specialty comments available.    Procedures:  No procedures performed Allergies: Patient has no known allergies.   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 Patient has been having recurrent rash. Last episode 2 months ago. Detailed counseling was provided the risk of untreated vasculitis was discussed which could be life-threatening. After discussing  indications side effects contraindications of different medications we decided to proceed with methotrexate. She was given handout on methotrexate. Our pharmacist Dr. Koleen Nimrod went over the medication with her at length. Handout was given and consent was taken. I plan to start her on methotrexate after labs are obtained. Which will be as follows:  Episcleritis of both eyes: She reports 2 episodes per year. And has been seen by ophthalmologist. She states that topical steroids have been helpful.  High risk medication use: She'll be starting methotrexate. According to rate records she has had a normal chest x-ray in the past. I will advised her to get pneumococcal vaccine. When she starts methotrexate, we will check her CBC and comprehensive metabolic panel every 2 weeks 3 and then every 2 months to monitor for drug toxicity. The methotrexate will be called in 2.5 mg tablets 4 tablets by mouth every week for 2 weeks if labs are normal we will increase it to 6 tablets by mouth every week for 2 weeks and then a tablets by mouth every weeks she will stay on . I will call in folic acid 1 mg tablet 2 tablets by mouth daily total 90 day supply with 3 refills will be given. She will need pneumococcal vaccine.  Alcohol use: Abstinence from alcohol was discussed. Which will be very important especially with methotrexate use.  Smoker: Smoking cessation was discussed.   Asthma, unspecified asthma severity, unspecified whether complicated, unspecified whether persistent  Allergic rhinitis, unspecified chronicity, unspecified seasonality, unspecified trigger  Noncompliance with medications in the past.   Orders: Orders Placed This Encounter  Procedures  . COMPLETE METABOLIC PANEL WITH GFR  . Urinalysis, Routine w reflex microscopic (not at [facility])  . Sed Rate (ESR)  . TSH  . Antinuclear Antib (ANA)  . CK Total (and CKMB)  . Pan-ANCA  . Rheumatoid factor  . Cyclic Citrul Peptide Antibody, IGG  .  Protein Electrophoresis, (serum)  . IgG, IgA, IgM  . Quantiferon tb gold assay (blood)  . CBC with Differential/Platelet   Meds ordered this encounter  Medications  . fexofenadine (ALLEGRA) 180 MG tablet    Sig: Take 180 mg by mouth daily.    Face-to-face time spent with patient was 68mnutes. 50% of time was spent in counseling and coordination of care.  Follow-Up Instructions: Next available follow-up   SBo Merino MD, FJulious Payer

## 2016-07-04 ENCOUNTER — Ambulatory Visit (INDEPENDENT_AMBULATORY_CARE_PROVIDER_SITE_OTHER): Payer: Managed Care, Other (non HMO) | Admitting: Rheumatology

## 2016-07-04 ENCOUNTER — Encounter: Payer: Self-pay | Admitting: Rheumatology

## 2016-07-04 VITALS — BP 153/98 | HR 84 | Resp 14 | Ht 68.0 in | Wt 260.0 lb

## 2016-07-04 DIAGNOSIS — Z86718 Personal history of other venous thrombosis and embolism: Secondary | ICD-10-CM | POA: Diagnosis not present

## 2016-07-04 DIAGNOSIS — J45909 Unspecified asthma, uncomplicated: Secondary | ICD-10-CM | POA: Diagnosis not present

## 2016-07-04 DIAGNOSIS — Z9119 Patient's noncompliance with other medical treatment and regimen: Secondary | ICD-10-CM | POA: Diagnosis not present

## 2016-07-04 DIAGNOSIS — Z79899 Other long term (current) drug therapy: Secondary | ICD-10-CM

## 2016-07-04 DIAGNOSIS — M818 Other osteoporosis without current pathological fracture: Secondary | ICD-10-CM | POA: Diagnosis not present

## 2016-07-04 DIAGNOSIS — F17200 Nicotine dependence, unspecified, uncomplicated: Secondary | ICD-10-CM | POA: Diagnosis not present

## 2016-07-04 DIAGNOSIS — I776 Arteritis, unspecified: Secondary | ICD-10-CM

## 2016-07-04 DIAGNOSIS — J309 Allergic rhinitis, unspecified: Secondary | ICD-10-CM | POA: Diagnosis not present

## 2016-07-04 DIAGNOSIS — Z91199 Patient's noncompliance with other medical treatment and regimen due to unspecified reason: Secondary | ICD-10-CM

## 2016-07-04 DIAGNOSIS — H15103 Unspecified episcleritis, bilateral: Secondary | ICD-10-CM

## 2016-07-04 LAB — CBC WITH DIFFERENTIAL/PLATELET
Basophils Absolute: 32 cells/uL (ref 0–200)
Basophils Relative: 1 %
Eosinophils Absolute: 128 cells/uL (ref 15–500)
Eosinophils Relative: 4 %
HCT: 41.6 % (ref 35.0–45.0)
Hemoglobin: 13.8 g/dL (ref 11.7–15.5)
Lymphocytes Relative: 32 %
Lymphs Abs: 1024 cells/uL (ref 850–3900)
MCH: 29.9 pg (ref 27.0–33.0)
MCHC: 33.2 g/dL (ref 32.0–36.0)
MCV: 90.2 fL (ref 80.0–100.0)
MPV: 10.6 fL (ref 7.5–12.5)
Monocytes Absolute: 224 cells/uL (ref 200–950)
Monocytes Relative: 7 %
Neutro Abs: 1792 cells/uL (ref 1500–7800)
Neutrophils Relative %: 56 %
Platelets: 222 10*3/uL (ref 140–400)
RBC: 4.61 MIL/uL (ref 3.80–5.10)
RDW: 13.7 % (ref 11.0–15.0)
WBC: 3.2 10*3/uL — ABNORMAL LOW (ref 3.8–10.8)

## 2016-07-04 NOTE — Progress Notes (Signed)
Pharmacy Note Subjective: Patient presents today to the [facility] to see Dr. Lacy Duverney. [place].  Patient seen by the pharmacist for counseling on methotrexate.    Objective: CBC - ordered today CMP - ordered today  TB Gold: ordered today Hepatitis B and C: negative (05/17/15) HIV: negative (05/17/15) Chest-xray:  Baseline X-ray from 2007  Assessment/Plan:  Patient was counseled on the purpose, proper use, and adverse effects of methotrexate including nausea, infection, and signs and symptoms of pneumonitis.  Reviewed instructions with patient to take methotrexate 4 tablets weekly for two weeks, then 6 tablets weekly for two weeks, then 8 tablets weekly along with folic acid 2mg  daily.  Discussed the importance of frequent monitoring of kidney and liver function and blood counts, and provided patient with standing lab instructions.  Advised patient that alcohol should be avoided while on methotrexate.  Counseled patient to avoid sulfa antibiotics such as Bactrim or Septra while on methotrexate.  Advised patient to get annual influenza vaccine and to get a pneumococcal vaccine if patient has not already had one.  Patient declines vaccinations at this time.  She was counseled on the risk versus benefits of vaccinations.  Provided patient with educational materials on methotrexate and answered all questions.  Patient consented to methotrexate use.  Will upload consent into the media tab.  Plan is to initiate patient on methotrexate once baseline labs return.    Elisabeth Most, Pharm.D., BCPS Clinical Pharmacist Pager: [phone] Phone: [phone] 07/04/2016 10:34 AM

## 2016-07-04 NOTE — Patient Instructions (Addendum)
We recommend getting a pneumonia vaccine and a yearly flu vaccine.    Methotrexate tablets What is this medicine? METHOTREXATE (METH oh TREX ate) is a chemotherapy drug used to treat cancer including breast cancer, leukemia, and lymphoma. This medicine can also be used to treat psoriasis and certain kinds of arthritis. This medicine may be used for other purposes; ask your health care provider or pharmacist if you have questions. What should I tell my health care provider before I take this medicine? They need to know if you have any of these conditions: -fluid in the stomach area or lungs -if you often drink alcohol -infection or immune system problems -kidney disease or on hemodialysis -liver disease -low blood counts, like low white cell, platelet, or red cell counts -lung disease -radiation therapy -stomach ulcers -ulcerative colitis -an unusual or allergic reaction to methotrexate, other medicines, foods, dyes, or preservatives -pregnant or trying to get pregnant -breast-feeding How should I use this medicine? Take this medicine by mouth with a glass of water. Follow the directions on the prescription label. Take your medicine at regular intervals. Do not take it more often than directed. Do not stop taking except on your doctor's advice. Make sure you know why you are taking this medicine and how often you should take it. If this medicine is used for a condition that is not cancer, like arthritis or psoriasis, it should be taken weekly, NOT daily. Taking this medicine more often than directed can cause serious side effects, even death. Talk to your healthcare provider about safe handling and disposal of this medicine. You may need to take special precautions. Talk to your pediatrician regarding the use of this medicine in children. While this drug may be prescribed for selected conditions, precautions do apply. Overdosage: If you think you have taken too much of this medicine contact  a poison control center or emergency room at once. NOTE: This medicine is only for you. Do not share this medicine with others. What if I miss a dose? If you miss a dose, talk with your doctor or health care professional. Do not take double or extra doses. What may interact with this medicine? This medicine may interact with the following medication: -acitretin -aspirin and aspirin-like medicines including salicylates -azathioprine -certain antibiotics like penicillins, tetracycline, and chloramphenicol -cyclosporine -gold -hydroxychloroquine -live virus vaccines -NSAIDs, medicines for pain and inflammation, like ibuprofen or naproxen -other cytotoxic agents -penicillamine -phenylbutazone -phenytoin -probenecid -retinoids such as isotretinoin and tretinoin -steroid medicines like prednisone or cortisone -sulfonamides like sulfasalazine and trimethoprim/sulfamethoxazole -theophylline This list may not describe all possible interactions. Give your health care provider a list of all the medicines, herbs, non-prescription drugs, or dietary supplements you use. Also tell them if you smoke, drink alcohol, or use illegal drugs. Some items may interact with your medicine. What should I watch for while using this medicine? Avoid alcoholic drinks. This medicine can make you more sensitive to the sun. Keep out of the sun. If you cannot avoid being in the sun, wear protective clothing and use sunscreen. Do not use sun lamps or tanning beds/booths. You may need blood work done while you are taking this medicine. Call your doctor or health care professional for advice if you get a fever, chills or sore throat, or other symptoms of a cold or flu. Do not treat yourself. This drug decreases your body's ability to fight infections. Try to avoid being around people who are sick. This medicine may increase your risk to bruise  or bleed. Call your doctor or health care professional if you notice any unusual  bleeding. Check with your doctor or health care professional if you get an attack of severe diarrhea, nausea and vomiting, or if you sweat a lot. The loss of too much body fluid can make it dangerous for you to take this medicine. Talk to your doctor about your risk of cancer. You may be more at risk for certain types of cancers if you take this medicine. Both men and women must use effective birth control with this medicine. Do not become pregnant while taking this medicine or until at least 1 normal menstrual cycle has occurred after stopping it. Women should inform their doctor if they wish to become pregnant or think they might be pregnant. Men should not father a child while taking this medicine and for 3 months after stopping it. There is a potential for serious side effects to an unborn child. Talk to your health care professional or pharmacist for more information. Do not breast-feed an infant while taking this medicine. What side effects may I notice from receiving this medicine? Side effects that you should report to your doctor or health care professional as soon as possible: -allergic reactions like skin rash, itching or hives, swelling of the face, lips, or tongue -breathing problems or shortness of breath -diarrhea -dry, nonproductive cough -low blood counts - this medicine may decrease the number of white blood cells, red blood cells and platelets. You may be at increased risk for infections and bleeding. -mouth sores -redness, blistering, peeling or loosening of the skin, including inside the mouth -signs of infection - fever or chills, cough, sore throat, pain or trouble passing urine -signs and symptoms of bleeding such as bloody or black, tarry stools; red or dark-brown urine; spitting up blood or brown material that looks like coffee grounds; red spots on the skin; unusual bruising or bleeding from the eye, gums, or nose -signs and symptoms of kidney injury like trouble passing  urine or change in the amount of urine -signs and symptoms of liver injury like dark yellow or brown urine; general ill feeling or flu-like symptoms; light-colored stools; loss of appetite; nausea; right upper belly pain; unusually weak or tired; yellowing of the eyes or skin Side effects that usually do not require medical attention (report to your doctor or health care professional if they continue or are bothersome): -dizziness -hair loss -tiredness -upset stomach -vomiting This list may not describe all possible side effects. Call your doctor for medical advice about side effects. You may report side effects to FDA at 1-800-FDA-1088. Where should I keep my medicine? Keep out of the reach of children. Store at room temperature between 20 and 25 degrees C (68 and 77 degrees F). Protect from light. Throw away any unused medicine after the expiration date. NOTE: This sheet is a summary. It may not cover all possible information. If you have questions about this medicine, talk to your doctor, pharmacist, or health care provider.    2016, [employer]vier/[employer] Standard. (2015-04-17 05:39:22)

## 2016-07-05 LAB — COMPLETE METABOLIC PANEL WITH GFR
ALT: 19 U/L (ref 6–29)
AST: 19 U/L (ref 10–30)
Albumin: 4.5 g/dL (ref 3.6–5.1)
Alkaline Phosphatase: 61 U/L (ref 33–115)
BUN: 10 mg/dL (ref 7–25)
CO2: 22 mmol/L (ref 20–31)
Calcium: 9.4 mg/dL (ref 8.6–10.2)
Chloride: 105 mmol/L (ref 98–110)
Creat: 0.81 mg/dL (ref 0.50–1.10)
GFR, Est African American: >89 mL/min (ref >=60)
GFR, Est Non African American: 89 mL/min (ref >=60)
Glucose, Bld: 101 mg/dL — ABNORMAL HIGH (ref 65–99)
Potassium: 4.3 mmol/L (ref 3.5–5.3)
Sodium: 138 mmol/L (ref 135–146)
Total Bilirubin: 0.4 mg/dL (ref 0.2–1.2)
Total Protein: 7.9 g/dL (ref 6.1–8.1)

## 2016-07-05 LAB — URINALYSIS, ROUTINE W REFLEX MICROSCOPIC
Bilirubin Urine: NEGATIVE
Glucose, UA: NEGATIVE
Hgb urine dipstick: NEGATIVE
Ketones, ur: NEGATIVE
Leukocytes, UA: NEGATIVE
Nitrite: NEGATIVE
Specific Gravity, Urine: 1.024 (ref 1.001–1.035)
pH: 6 (ref 5.0–8.0)

## 2016-07-05 LAB — IGG, IGA, IGM
IgA: 217 mg/dL (ref 81–463)
IgG (Immunoglobin G), Serum: 1319 mg/dL (ref 694–1618)
IgM, Serum: 139 mg/dL (ref 48–271)

## 2016-07-05 LAB — PAN-ANCA
ANCA Screen: NEGATIVE
Myeloperoxidase Abs: <1
Serine Protease 3: <1

## 2016-07-05 LAB — URINALYSIS, MICROSCOPIC ONLY
Bacteria, UA: NONE SEEN [HPF]
Casts: NONE SEEN [LPF]
Crystals: NONE SEEN [HPF]
Yeast: NONE SEEN [HPF]

## 2016-07-05 LAB — CK TOTAL AND CKMB (NOT AT ARMC)
CK, MB: <0.7 ng/mL (ref 0.0–5.0)
Total CK: 200 U/L — ABNORMAL HIGH (ref 7–177)

## 2016-07-05 LAB — ANA: Anti Nuclear Antibody(ANA): NEGATIVE

## 2016-07-05 LAB — SEDIMENTATION RATE: Sed Rate: 14 mm/hr (ref 0–20)

## 2016-07-05 LAB — CYCLIC CITRUL PEPTIDE ANTIBODY, IGG: Cyclic Citrullin Peptide Ab: <16 Units

## 2016-07-05 LAB — RHEUMATOID FACTOR: Rhuematoid fact SerPl-aCnc: <14 IU/mL (ref <14)

## 2016-07-05 LAB — TSH: TSH: 0.59 mIU/L

## 2016-07-06 LAB — QUANTIFERON TB GOLD ASSAY (BLOOD)
Interferon Gamma Release Assay: NEGATIVE
Mitogen-Nil: 7.04 IU/mL
Quantiferon Nil Value: 0.04 IU/mL
Quantiferon Tb Ag Minus Nil Value: 0 IU/mL

## 2016-07-08 LAB — PROTEIN ELECTROPHORESIS, SERUM
Albumin ELP: 4.3 g/dL (ref 3.8–4.8)
Alpha-1-Globulin: 0.4 g/dL — ABNORMAL HIGH (ref 0.2–0.3)
Alpha-2-Globulin: 1 g/dL — ABNORMAL HIGH (ref 0.5–0.9)
Beta 2: 0.5 g/dL (ref 0.2–0.5)
Beta Globulin: 0.4 g/dL (ref 0.4–0.6)
Gamma Globulin: 1.2 g/dL (ref 0.8–1.7)
Total Protein, Serum Electrophoresis: 7.9 g/dL (ref 6.1–8.1)

## 2016-07-09 NOTE — Progress Notes (Signed)
Please forward labs to PCP for review

## 2016-07-15 ENCOUNTER — Other Ambulatory Visit: Payer: Self-pay | Admitting: Nurse Practitioner

## 2016-07-15 NOTE — Telephone Encounter (Signed)
Medication refill request: Depo-Provera Last AEX:  06/05/16 BS Next AEX: 06/13/17 Last MMG (if hormonal medication request): 07/31/15 BIRADS 1 negative Refill authorized: 06/05/16 #66mL w/3 refills;  Will call pharmacy

## 2016-07-17 ENCOUNTER — Other Ambulatory Visit: Payer: Self-pay | Admitting: Obstetrics and Gynecology

## 2016-07-17 DIAGNOSIS — Z1231 Encounter for screening mammogram for malignant neoplasm of breast: Secondary | ICD-10-CM

## 2016-08-08 NOTE — Progress Notes (Deleted)
Office Visit Note  Patient: Joyce Zhang             Date of Birth: 1971/04/06           MRN: [id number]             PCP: Bartholome Bill, MD Referring: Bartholome Bill, MD Visit Date: 08/13/2016 Occupation: '@GUAROCC'$ @    Subjective:  No chief complaint on file.   History of Present Illness: Joyce Zhang is a 45 y.o. female ***   Activities of Daily Living:  Patient reports morning stiffness for *** {minute/hour:19697}.   Patient {ACTIONS;DENIES/REPORTS:21021675::"Denies"} nocturnal pain.  Difficulty dressing/grooming: {ACTIONS;DENIES/REPORTS:21021675::"Denies"} Difficulty climbing stairs: {ACTIONS;DENIES/REPORTS:21021675::"Denies"} Difficulty getting out of chair: {ACTIONS;DENIES/REPORTS:21021675::"Denies"} Difficulty using hands for taps, buttons, cutlery, and/or writing: {ACTIONS;DENIES/REPORTS:21021675::"Denies"}   No Rheumatology ROS completed.   PMFS History:  Patient Active Problem List   Diagnosis Date Noted  . Vasculitis on skin biopsy ([place]) 07/02/2016  . Episcleritis of both eyes 07/02/2016  . High risk medication use 07/02/2016  . Asthma 07/02/2016  . Allergic rhinitis 07/02/2016  . Noncompliance 07/02/2016  . History of DVT (deep vein thrombosis) 07/02/2016  . OP (osteoporosis) 07/02/2016  . Smoker 07/02/2016    Past Medical History:  Diagnosis Date  . Dysmenorrhea   . Episcleritis of both eyes   . Fibroid   . H/O vasculitis    in legs    Family History  Problem Relation Age of Onset  . Adopted: Yes  . Diabetes Mother   . Asthma Mother   . Breast cancer Mother   . Diabetes Maternal Grandmother   . Asthma Maternal Grandmother   . Breast cancer Maternal Grandmother   . Diabetes Maternal Grandfather    Past Surgical History:  Procedure Laterality Date  . broken foot Right   . fracture of index finger Left    Social History   Social History Narrative  . No narrative on file     Objective: Vital Signs: There were no vitals  taken for this visit.   Physical Exam   Musculoskeletal Exam: ***  CDAI Exam: No CDAI exam completed.    Investigation: No additional findings.  Office Visit on 07/04/2016  Component Date Value Ref Range Status  . Sodium 07/05/2016 138  135 - 146 mmol/L Final  . Potassium 07/05/2016 4.3  3.5 - 5.3 mmol/L Final  . Chloride 07/05/2016 105  98 - 110 mmol/L Final  . CO2 07/05/2016 22  20 - 31 mmol/L Final  . Glucose, Bld 07/05/2016 101* 65 - 99 mg/dL Final  . BUN 07/05/2016 10  7 - 25 mg/dL Final  . Creat 07/05/2016 0.81  0.50 - 1.10 mg/dL Final  . Total Bilirubin 07/05/2016 0.4  0.2 - 1.2 mg/dL Final  . Alkaline Phosphatase 07/05/2016 61  33 - 115 U/L Final  . AST 07/05/2016 19  10 - 30 U/L Final  . ALT 07/05/2016 19  6 - 29 U/L Final  . Total Protein 07/05/2016 7.9  6.1 - 8.1 g/dL Final  . Albumin 07/05/2016 4.5  3.6 - 5.1 g/dL Final  . Calcium 07/05/2016 9.4  8.6 - 10.2 mg/dL Final  . GFR, Est African American 07/05/2016 >89  >=60 mL/min Final  . GFR, Est Non African American 07/05/2016 89  >=60 mL/min Final  . Color, Urine 07/05/2016 DARK YELLOW  YELLOW Final  . APPearance 07/05/2016 CLEAR  CLEAR Final  . Specific Gravity, Urine 07/05/2016 1.024  1.001 - 1.035 Final  . pH 07/05/2016 6.0  5.0 - 8.0 Final  . Glucose, UA 07/05/2016 NEGATIVE  NEGATIVE Final  . Bilirubin Urine 07/05/2016 NEGATIVE  NEGATIVE Final  . Ketones, ur 07/05/2016 NEGATIVE  NEGATIVE Final  . Hgb urine dipstick 07/05/2016 NEGATIVE  NEGATIVE Final  . Protein, ur 07/05/2016 1+* NEGATIVE Final  . Nitrite 07/05/2016 NEGATIVE  NEGATIVE Final  . Leukocytes, UA 07/05/2016 NEGATIVE  NEGATIVE Final  . Sed Rate 07/05/2016 14  0 - 20 mm/hr Final  . TSH 07/05/2016 0.59  mIU/L Final   Comment:   Reference Range   > or = 20 Years  0.40-4.50   Pregnancy Range First trimester  0.26-2.66 Second trimester 0.55-2.73 Third trimester  0.43-2.91     . Anit Nuclear Antibody(ANA) 07/05/2016 NEG  NEGATIVE Final  .  Total CK 07/05/2016 200* 7 - 177 U/L Final  . CK, MB 07/05/2016 <0.7  0.0 - 5.0 ng/mL Final  . Relative Index 07/05/2016 SEE NOTE  0.0 - 4.0 Final   Comment:   Result not calculated because one or more required values exceed analytical limits.     Marland Kitchen ANCA Screen 07/05/2016 Negative   Final   Comment:             ** Normal Reference Range: Negative **   ANCA Screen includes evaluation for p-ANCA, c-ANCA and Atypical p-ANCA.   Marland Kitchen Myeloperoxidase Abs 07/05/2016 <1.0  <1.0 AI Final   Comment:                                 Value   Interpretation                              <1.0 AI: No Antibody Detected                           >or=1.0 AI: Antibody Detected   Autoantibodies to myeloperoxidase (MPO) are commonly associated with the following small-vessel vasculitides: microscopic polyangiitis, polyarteritis nodosa, Churg-Strauss syndrome, necrotizing and crescentic glomerulonephritis and occasionally Wegener's granulomatosis. The perinuclear IFA pattern, (p-ANCA), is based largely on autoantibody to myeloperoxidase which serves as the primary antigen   These autoantibodies are present in active disease state.     . Serine Protease 3 07/05/2016 <1.0  <1.0 AI Final   Comment:                                 Value   Interpretation                              <1.0 AI: No Antibody Detected                           >or=1.0 AI: Antibody Detected   Autoantibodies to proteinase-3 (PR-3) are accepted as characteristic for granulomatosis with polyangiitis (Wegener's), and are detectable in 95% of the histologically proven cases. The cytoplasmic IFA pattern, (c-ANCA), is based largely on autoantibody to PR-3 which serves as the primary antigen.   These autoantibodies are present in active disease state.     Marland Kitchen Rhuematoid fact SerPl-aCnc 07/05/2016 <14  <14 IU/mL Final  . Cyclic Citrullin Peptide Ab 07/05/2016 <16  Units Final   Comment:   Reference Range  Negative                < 20 Weak Positive            20 - 39 Moderate Positive        40 - 59 Strong Positive        > 59   . Total Protein, Serum Electrophores* 07/08/2016 7.9  6.1 - 8.1 g/dL Final  . Albumin ELP 07/08/2016 4.3  3.8 - 4.8 g/dL Final  . Alpha-1-Globulin 07/08/2016 0.4* 0.2 - 0.3 g/dL Final  . Alpha-2-Globulin 07/08/2016 1.0* 0.5 - 0.9 g/dL Final  . Beta Globulin 07/08/2016 0.4  0.4 - 0.6 g/dL Final  . Beta 2 07/08/2016 0.5  0.2 - 0.5 g/dL Final  . Gamma Globulin 07/08/2016 1.2  0.8 - 1.7 g/dL Final  . Abnormal Protein Band1 07/08/2016 NOT DET  g/dL Final  . SPE Interp. 07/08/2016 SEE NOTE   Final   Comment: Evaluation is consistent with an acute inflammatory pattern. Reviewed by Odis Hollingshead, MD, PhD, FCAP (Electronic Signature on File)   . Abnormal Protein Band2 07/08/2016 NOT DET  g/dL Final  . Abnormal Protein Band3 07/08/2016 NOT DET  g/dL Final  . IgG (Immunoglobin G), Serum 07/05/2016 1319  694 - 1,618 mg/dL Final  . IgA 07/05/2016 217  81 - 463 mg/dL Final  . IgM, Serum 07/05/2016 139  48 - 271 mg/dL Final  . Interferon Gamma Release Assay 07/06/2016 NEGATIVE  NEGATIVE Final  . Quantiferon Nil Value 07/06/2016 0.04  IU/mL Final  . Mitogen-Nil 07/06/2016 7.04  IU/mL Final  . Quantiferon Tb Ag Minus Nil Value 07/06/2016 0.00  IU/mL Final   Comment:   The Nil tube value is used to determine if the patient has a preexisting immune response which could cause a false-positive reading on the test. In order for a test to be valid, the Nil tube must have a value of less than or equal to 8.0 IU/mL.   The mitogen control tube is used to assure the patient has a healthy immune status and also serves as a control for correct blood handling and incubation. It is used to detect false-negative readings. The mitogen tube must have a gamma interferon value of greater than or equal to 0.5 IU/mL higher than the value of the Nil tube.   The TB antigen tube is coated with the M. tuberculosis  specific antigens. For a test to be considered positive, the TB antigen tube value minus the Nil tube value must be greater than or equal to 0.35 IU/mL.   For additional information, please refer to http://education.[facility].com/faq/QFT (This link is being provided for informational/educational purposes only.)   . WBC 07/04/2016 3.2* 3.8 - 10.8 K/uL Final  . RBC 07/04/2016 4.61  3.80 - 5.10 MIL/uL Final  . Hemoglobin 07/04/2016 13.8  11.7 - 15.5 g/dL Final  . HCT 07/04/2016 41.6  35.0 - 45.0 % Final  . MCV 07/04/2016 90.2  80.0 - 100.0 fL Final  . MCH 07/04/2016 29.9  27.0 - 33.0 pg Final  . MCHC 07/04/2016 33.2  32.0 - 36.0 g/dL Final  . RDW 07/04/2016 13.7  11.0 - 15.0 % Final  . Platelets 07/04/2016 222  140 - 400 K/uL Final  . MPV 07/04/2016 10.6  7.5 - 12.5 fL Final  . Neutro Abs 07/04/2016 1792  1,500 - 7,800 cells/uL Final  . Lymphs Abs 07/04/2016 1024  850 - 3,900 cells/uL Final  . Monocytes Absolute 07/04/2016 224  200 - 950 cells/uL Final  . Eosinophils Absolute 07/04/2016 128  15 - 500 cells/uL Final  . Basophils Absolute 07/04/2016 32  0 - 200 cells/uL Final  . Neutrophils Relative % 07/04/2016 56  % Final  . Lymphocytes Relative 07/04/2016 32  % Final  . Monocytes Relative 07/04/2016 7  % Final  . Eosinophils Relative 07/04/2016 4  % Final  . Basophils Relative 07/04/2016 1  % Final  . Smear Review 07/04/2016 Criteria for review not met   Final  . WBC, UA 07/05/2016 0-5  <=5 WBC/HPF Final  . RBC / HPF 07/05/2016 0-2  <=2 RBC/HPF Final  . Squamous Epithelial / LPF 07/05/2016 0-5  <=5 HPF Final  . Bacteria, UA 07/05/2016 NONE SEEN  NONE SEEN HPF Final  . Crystals 07/05/2016 NONE SEEN  NONE SEEN HPF Final  . Casts 07/05/2016 NONE SEEN  NONE SEEN LPF Final  . Yeast 07/05/2016 NONE SEEN  NONE SEEN HPF Final    Imaging: No results found.  Speciality Comments: No specialty comments available.    Procedures:  No procedures performed Allergies: Patient has  no known allergies.   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 Patient has been having recurrent rash. Last episode 2 months ago. Detailed counseling was provided the risk of untreated vasculitis was discussed which could be life-threatening. After discussing indications side effects contraindications of different medications we decided to proceed with methotrexate. She was given handout on methotrexate. Our pharmacist Dr. Koleen Nimrod went over the medication with her at length. Handout was given and consent was taken. I plan to start her on methotrexate after labs are obtained. Which will be as follows:  Episcleritis of both eyes: She reports 2 episodes per year. And has been seen by ophthalmologist. She states that topical steroids have been helpful.  High risk medication use: She'll be starting methotrexate. According to rate records she has had a normal chest x-ray in the past. I will advised her to get pneumococcal vaccine. When she starts methotrexate, we will check her CBC and comprehensive metabolic panel every 2 weeks 3 and then every 2 months to monitor for drug toxicity. The methotrexate will be called in 2.5 mg tablets 4 tablets by mouth every week for 2 weeks if labs are normal we will increase it to 6 tablets by mouth every week for 2 weeks and then a tablets by mouth every weeks she will stay on . I will call in folic acid 1 mg tablet 2 tablets by mouth daily total 90 day supply with 3 refills will be given. She will need pneumococcal vaccine.  Alcohol use:  Abstinence from alcohol was discussed. Which will be very important especially with methotrexate use.  Smoker: Smoking cessation was discussed.   Asthma, unspecified asthma severity, unspecified whether complicated, unspecified whether persistent  Allergic rhinitis, unspecified chronicity, unspecified seasonality, unspecified trigger  Orders: No orders of the defined types were placed in this encounter.  No orders of the defined types were placed in this encounter.   Face-to-face time spent with patient was *** minutes. 50% of time  was spent in counseling and coordination of care.  Follow-Up Instructions: No Follow-up on file.   Amy Littrell, RT

## 2016-08-09 ENCOUNTER — Ambulatory Visit
Admission: RE | Admit: 2016-08-09 | Discharge: 2016-08-09 | Disposition: A | Discharge status: Home / Self Care | Payer: 59 | Source: Ambulatory Visit | Attending: Obstetrics and Gynecology | Admitting: Obstetrics and Gynecology

## 2016-08-09 DIAGNOSIS — Z1231 Encounter for screening mammogram for malignant neoplasm of breast: Secondary | ICD-10-CM

## 2016-08-13 ENCOUNTER — Ambulatory Visit: Payer: Managed Care, Other (non HMO) | Admitting: Rheumatology

## 2016-08-13 ENCOUNTER — Telehealth: Payer: Self-pay | Admitting: Pharmacist

## 2016-08-13 NOTE — Telephone Encounter (Signed)
Plan per Dr. Estanislado Pandy on 07/04/16 visit:  "I plan to start her on methotrexate after labs are obtained. Which will be as follows:  The methotrexate will be called in 2.5 mg tablets 4 tablets by mouth every week for 2 weeks if labs are normal we will increase it to 6 tablets by mouth every week for 2 weeks and then a tablets by mouth every weeks she will stay on . I will call in folic acid 1 mg tablet 2 tablets by mouth daily total 90 day supply with 3 refills will be given."  "When she starts methotrexate, we will check her CBC and comprehensive metabolic panel every 2 weeks 3 and then every 2 months to monitor for drug toxicity."    Patient was scheduled to be in the office today for follow up but had to reschedule her visit.  I called her to discuss methotrexate.  Patient states she would like to wait until her follow up visit 09/16/16 visit before initiation of methotrexate.

## 2016-09-13 DIAGNOSIS — Z789 Other specified health status: Secondary | ICD-10-CM | POA: Insufficient documentation

## 2016-09-13 DIAGNOSIS — Z7289 Other problems related to lifestyle: Secondary | ICD-10-CM | POA: Insufficient documentation

## 2016-09-13 NOTE — Progress Notes (Signed)
Office Visit Note  Patient: Joyce Zhang             Date of Birth: 01-02-1971           MRN: [id number]             PCP: Bartholome Bill, MD Referring: Bartholome Bill, MD Visit Date: 09/16/2016 Occupation: Asst. analyst    Subjective: Rash   History of Present Illness: Joyce Zhang is a 46 y.o. female with history of leukocytoclastic vasculitis. She had a small breakout on her lower extremities about 2 days ago. It is already getting better. She has not had any joint swelling or joint pain since the last visit. No episodes of episcleritis since October 2017.  Activities of Daily Living:  Patient reports morning stiffness for 0 minutes.   Patient Denies nocturnal pain.  Difficulty dressing/grooming: Denies Difficulty climbing stairs: Denies Difficulty getting out of chair: Denies Difficulty using hands for taps, buttons, cutlery, and/or writing: Denies   Review of Systems  Constitutional: Negative for fatigue, night sweats, weight gain, weight loss and weakness.  HENT: Negative for mouth sores, trouble swallowing, trouble swallowing, mouth dryness and nose dryness.   Eyes: Negative for pain, redness, visual disturbance and dryness.  Respiratory: Negative for cough, shortness of breath and difficulty breathing.   Cardiovascular: Negative for chest pain, palpitations, hypertension, irregular heartbeat and swelling in legs/feet.  Gastrointestinal: Negative for blood in stool, constipation and diarrhea.  Endocrine: Negative for increased urination.  Genitourinary: Negative for vaginal dryness.  Musculoskeletal: Negative for arthralgias, joint pain, joint swelling, myalgias, muscle weakness, morning stiffness, muscle tenderness and myalgias.  Skin: Positive for rash. Negative for color change, hair loss, skin tightness, ulcers and sensitivity to sunlight.  Allergic/Immunologic: Negative for susceptible to infections.  Neurological: Negative for dizziness, memory loss  and night sweats.  Hematological: Negative for swollen glands.  Psychiatric/Behavioral: Negative for depressed mood and sleep disturbance. The patient is not nervous/anxious.     PMFS History:  Patient Active Problem List   Diagnosis Date Noted  . Alcohol use 09/13/2016  . Vasculitis on skin biopsy ([place]) 07/02/2016  . Episcleritis of both eyes 07/02/2016  . High risk medication use 07/02/2016  . Asthma 07/02/2016  . Allergic rhinitis 07/02/2016  . Noncompliance 07/02/2016  . History of DVT (deep vein thrombosis) 07/02/2016  . OP (osteoporosis) 07/02/2016  . Smoker 07/02/2016    Past Medical History:  Diagnosis Date  . Dysmenorrhea   . Episcleritis of both eyes   . Fibroid   . H/O vasculitis    in legs    Family History  Problem Relation Age of Onset  . Adopted: Yes  . Diabetes Mother   . Asthma Mother   . Breast cancer Mother   . Diabetes Maternal Grandmother   . Asthma Maternal Grandmother   . Breast cancer Maternal Grandmother   . Diabetes Maternal Grandfather    Past Surgical History:  Procedure Laterality Date  . broken foot Right   . fracture of index finger Left    Social History   Social History Narrative  . No narrative on file     Objective: Vital Signs: BP (!) 160/87 (BP Location: Left Arm, Patient Position: Sitting)   Pulse 67   Ht 5' 8" (1.727 m)   Wt 265 lb (120.2 kg)   BMI 40.29 kg/m    Physical Exam  Constitutional: She is oriented to person, place, and time. She appears well-developed and well-nourished.  HENT:  Head:  Normocephalic and atraumatic.  Eyes: Conjunctivae and EOM are normal.  Neck: Normal range of motion.  Cardiovascular: Normal rate, regular rhythm, normal heart sounds and intact distal pulses.   Pulmonary/Chest: Effort normal and breath sounds normal.  Abdominal: Soft. Bowel sounds are normal.  Lymphadenopathy:    She has no cervical adenopathy.  Neurological: She is alert and oriented to person, place, and time.    Skin: Skin is warm and dry. Capillary refill takes less than 2 seconds.  Old hyperpigmented lesions noted on bilateral lower extremities. Few scattered lesions noted on right lower extremity with erythema.  Psychiatric: She has a normal mood and affect. Her behavior is normal.  Nursing note and vitals reviewed.    Musculoskeletal Exam: C-spine, thoracic, lumbar spine good range of motion she has good range of motion of her bilateral shoulders elbows wrist joint MCPs PIPs DIPs hip joints knee joints ankles MTPs PIPs DIPs with no synovitis hip joints knee joints ankles MTPs PIPs DIPs with good range of motion with no synovitis.  CDAI Exam: No CDAI exam completed.    Investigation: Findings:  07/09/2016 CBC WBC 3.2, CMP normal, UA negative, ESR 14, TSH normal, CK 200(7-177), SPEP normal, immunoglobulins normal, TB: Negative ANA negative, RF negative, CCP negative, P ANCA negative    Imaging: No results found.  Speciality Comments: No specialty comments available.    Procedures:  No procedures performed Allergies: Patient has no known allergies.   Assessment / Plan:     Visit Diagnoses: Leukocytoclastic vasculitis ([place]) - Positive for skin biopsy, rash, epi scleritis treated with prednisone 2010, 2012 and methotrexate till 2013, all autoimmune workup negative 07/09/2016, ESR norm  Episcleritis of both eyes  High risk medication use - Plan: CBC with Differential/Platelet, COMPLETE METABOLIC PANEL WITH GFR  Allergic rhinitis  Noncompliance - with medications the past  Asthma  Smoker  Alcohol use    Orders: Orders Placed This Encounter  Procedures  . CBC with Differential/Platelet  . COMPLETE METABOLIC PANEL WITH GFR   Meds ordered this encounter  Medications  . methotrexate (RHEUMATREX) 2.5 MG tablet    Sig: Take 4 tab PO weekly for two weeks, then 6 tab PO weekly for two weeks, then 8 tab PO weekly.  Caution:Chemotherapy. Protect from light.    Dispense:  84  tablet    Refill:  0  . folic acid (FOLVITE) 1 MG tablet    Sig: Take 2 tablets (2 mg total) by mouth daily.    Dispense:  180 tablet    Refill:  3    Face-to-face time spent with patient was 30 minutes. 50% of time was spent in counseling and coordination of care.  Follow-Up Instructions: Return in about 2 months (around 11/14/2016) for Vasculitis.   Bo Merino, MD  Note - This record has been created using Editor, commissioning.  Chart creation errors have been sought, but may not always  have been located. Such creation errors do not reflect on  the standard of medical care.

## 2016-09-16 ENCOUNTER — Ambulatory Visit (INDEPENDENT_AMBULATORY_CARE_PROVIDER_SITE_OTHER): Payer: Managed Care, Other (non HMO) | Admitting: Rheumatology

## 2016-09-16 ENCOUNTER — Encounter: Payer: Self-pay | Admitting: Rheumatology

## 2016-09-16 VITALS — BP 160/87 | HR 67 | Ht 68.0 in | Wt 265.0 lb

## 2016-09-16 DIAGNOSIS — J309 Allergic rhinitis, unspecified: Secondary | ICD-10-CM | POA: Diagnosis not present

## 2016-09-16 DIAGNOSIS — Z789 Other specified health status: Secondary | ICD-10-CM | POA: Diagnosis not present

## 2016-09-16 DIAGNOSIS — J45909 Unspecified asthma, uncomplicated: Secondary | ICD-10-CM

## 2016-09-16 DIAGNOSIS — Z91199 Patient's noncompliance with other medical treatment and regimen due to unspecified reason: Secondary | ICD-10-CM

## 2016-09-16 DIAGNOSIS — F17200 Nicotine dependence, unspecified, uncomplicated: Secondary | ICD-10-CM | POA: Diagnosis not present

## 2016-09-16 DIAGNOSIS — Z9119 Patient's noncompliance with other medical treatment and regimen: Secondary | ICD-10-CM | POA: Diagnosis not present

## 2016-09-16 DIAGNOSIS — H15103 Unspecified episcleritis, bilateral: Secondary | ICD-10-CM

## 2016-09-16 DIAGNOSIS — M310 Hypersensitivity angiitis: Secondary | ICD-10-CM | POA: Diagnosis not present

## 2016-09-16 DIAGNOSIS — Z79899 Other long term (current) drug therapy: Secondary | ICD-10-CM

## 2016-09-16 DIAGNOSIS — Z7289 Other problems related to lifestyle: Secondary | ICD-10-CM

## 2016-09-16 MED ORDER — FOLIC ACID 1 MG PO TABS: 2.0000 mg | ORAL_TABLET | Freq: Every day | ORAL | 3 refills | Status: DC

## 2016-09-16 MED ORDER — METHOTREXATE 2.5 MG PO TABS: ORAL_TABLET | ORAL | 0 refills | Status: DC

## 2016-09-16 NOTE — Patient Instructions (Signed)
Standing Labs We placed an order today for your standing lab work.    Please come back and get your standing labs every 2 weeks times 3, then every 2 months.    We have open lab Monday through Friday from 8:30-11:30 AM and 1:30-4 PM at the office of Dr. Tresa Moore, PA.   The office is located at [address] [place], [place], [place] [address] No appointment is necessary.   Labs are drawn by [facility].  You may receive a bill from [facility] for your lab work.     Methotrexate tablets What is this medicine? METHOTREXATE (METH oh TREX ate) is a chemotherapy drug used to treat cancer including breast cancer, leukemia, and lymphoma. This medicine can also be used to treat psoriasis and certain kinds of arthritis. This medicine may be used for other purposes; ask your health care provider or pharmacist if you have questions. COMMON BRAND NAME(S): Rheumatrex, Trexall What should I tell my health care provider before I take this medicine? They need to know if you have any of these conditions: -fluid in the stomach area or lungs -if you often drink alcohol -infection or immune system problems -kidney disease or on hemodialysis -liver disease -low blood counts, like low white cell, platelet, or red cell counts -lung disease -radiation therapy -stomach ulcers -ulcerative colitis -an unusual or allergic reaction to methotrexate, other medicines, foods, dyes, or preservatives -pregnant or trying to get pregnant -breast-feeding How should I use this medicine? Take this medicine by mouth with a glass of water. Follow the directions on the prescription label. Take your medicine at regular intervals. Do not take it more often than directed. Do not stop taking except on your doctor's advice. Make sure you know why you are taking this medicine and how often you should take it. If this medicine is used for a condition that is not cancer, like arthritis or psoriasis, it should be  taken weekly, NOT daily. Taking this medicine more often than directed can cause serious side effects, even death. Talk to your healthcare provider about safe handling and disposal of this medicine. You may need to take special precautions. Talk to your pediatrician regarding the use of this medicine in children. While this drug may be prescribed for selected conditions, precautions do apply. Overdosage: If you think you have taken too much of this medicine contact a poison control center or emergency room at once. NOTE: This medicine is only for you. Do not share this medicine with others. What if I miss a dose? If you miss a dose, talk with your doctor or health care professional. Do not take double or extra doses. What may interact with this medicine? This medicine may interact with the following medication: -acitretin -aspirin and aspirin-like medicines including salicylates -azathioprine -certain antibiotics like penicillins, tetracycline, and chloramphenicol -cyclosporine -gold -hydroxychloroquine -live virus vaccines -NSAIDs, medicines for pain and inflammation, like ibuprofen or naproxen -other cytotoxic agents -penicillamine -phenylbutazone -phenytoin -probenecid -retinoids such as isotretinoin and tretinoin -steroid medicines like prednisone or cortisone -sulfonamides like sulfasalazine and trimethoprim/sulfamethoxazole -theophylline This list may not describe all possible interactions. Give your health care provider a list of all the medicines, herbs, non-prescription drugs, or dietary supplements you use. Also tell them if you smoke, drink alcohol, or use illegal drugs. Some items may interact with your medicine. What should I watch for while using this medicine? Avoid alcoholic drinks. This medicine can make you more sensitive to the sun. Keep out of the sun.  If you cannot avoid being in the sun, wear protective clothing and use sunscreen. Do not use sun lamps or tanning  beds/booths. You may need blood work done while you are taking this medicine. Call your doctor or health care professional for advice if you get a fever, chills or sore throat, or other symptoms of a cold or flu. Do not treat yourself. This drug decreases your body's ability to fight infections. Try to avoid being around people who are sick. This medicine may increase your risk to bruise or bleed. Call your doctor or health care professional if you notice any unusual bleeding. Check with your doctor or health care professional if you get an attack of severe diarrhea, nausea and vomiting, or if you sweat a lot. The loss of too much body fluid can make it dangerous for you to take this medicine. Talk to your doctor about your risk of cancer. You may be more at risk for certain types of cancers if you take this medicine. Both men and women must use effective birth control with this medicine. Do not become pregnant while taking this medicine or until at least 1 normal menstrual cycle has occurred after stopping it. Women should inform their doctor if they wish to become pregnant or think they might be pregnant. Men should not father a child while taking this medicine and for 3 months after stopping it. There is a potential for serious side effects to an unborn child. Talk to your health care professional or pharmacist for more information. Do not breast-feed an infant while taking this medicine. What side effects may I notice from receiving this medicine? Side effects that you should report to your doctor or health care professional as soon as possible: -allergic reactions like skin rash, itching or hives, swelling of the face, lips, or tongue -breathing problems or shortness of breath -diarrhea -dry, nonproductive cough -low blood counts - this medicine may decrease the number of white blood cells, red blood cells and platelets. You may be at increased risk for infections and bleeding. -mouth  sores -redness, blistering, peeling or loosening of the skin, including inside the mouth -signs of infection - fever or chills, cough, sore throat, pain or trouble passing urine -signs and symptoms of bleeding such as bloody or black, tarry stools; red or dark-brown urine; spitting up blood or brown material that looks like coffee grounds; red spots on the skin; unusual bruising or bleeding from the eye, gums, or nose -signs and symptoms of kidney injury like trouble passing urine or change in the amount of urine -signs and symptoms of liver injury like dark yellow or brown urine; general ill feeling or flu-like symptoms; light-colored stools; loss of appetite; nausea; right upper belly pain; unusually weak or tired; yellowing of the eyes or skin Side effects that usually do not require medical attention (report to your doctor or health care professional if they continue or are bothersome): -dizziness -hair loss -tiredness -upset stomach -vomiting This list may not describe all possible side effects. Call your doctor for medical advice about side effects. You may report side effects to FDA at 1-800-FDA-1088. Where should I keep my medicine? Keep out of the reach of children. Store at room temperature between 20 and 25 degrees C (68 and 77 degrees F). Protect from light. Throw away any unused medicine after the expiration date. NOTE: This sheet is a summary. It may not cover all possible information. If you have questions about this medicine, talk to your  doctor, pharmacist, or health care provider.  2017 Elsevier/Gold Standard (2015-04-17 05:39:22)

## 2016-09-16 NOTE — Progress Notes (Signed)
Pharmacy Note Subjective: Patient presents today to the [facility] to see Dr. Estanislado Pandy.  Patient seen by the pharmacist for counseling on methotrexate.    Objective: CBC    Component Value Date/Time   WBC 3.2 (L) 07/04/2016 0925   RBC 4.61 07/04/2016 0925   HGB 13.8 07/04/2016 0925   HCT 41.6 07/04/2016 0925   PLT 222 07/04/2016 0925   MCV 90.2 07/04/2016 0925   MCH 29.9 07/04/2016 0925   MCHC 33.2 07/04/2016 0925   RDW 13.7 07/04/2016 0925   LYMPHSABS 1,024 07/04/2016 0925   MONOABS 224 07/04/2016 0925   EOSABS 128 07/04/2016 0925   BASOSABS 32 07/04/2016 0925    CMP     Component Value Date/Time   NA 138 07/04/2016 0925   K 4.3 07/04/2016 0925   CL 105 07/04/2016 0925   CO2 22 07/04/2016 0925   GLUCOSE 101 (H) 07/04/2016 0925   BUN 10 07/04/2016 0925   CREATININE 0.81 07/04/2016 0925   CALCIUM 9.4 07/04/2016 0925   PROT 7.9 07/04/2016 0925   ALBUMIN 4.5 07/04/2016 0925   AST 19 07/04/2016 0925   ALT 19 07/04/2016 0925   ALKPHOS 61 07/04/2016 0925   BILITOT 0.4 07/04/2016 0925   GFRNONAA 89 07/04/2016 0925   GFRAA >89 07/04/2016 0925    TB Gold: ordered today Hepatitis B and C: negative (05/17/15) HIV: negative (05/17/15) Chest-xray:  Baseline X-ray from 2007   Assessment/Plan:  Patient was counseled on the purpose, proper use, and adverse effects of methotrexate including nausea, infection, and signs and symptoms of pneumonitis.  Reviewed instructions with patient to take methotrexate 4 tablets weekly for two weeks, then 6 tablets weekly for two weeks, then 8 tablets weekly along with folic acid 2mg  daily.  Discussed the importance of frequent monitoring of kidney and liver function and blood counts, and provided patient with standing lab instructions.  Advised patient that alcohol should be avoided while on methotrexate.  Also discussed importance of strict contraception while on methotrexate and contraindication of pregnancy.  Patient confirms she  is on Depo-Provera injections.  Counseled patient to avoid sulfa antibiotics such as Bactrim or Septra while on methotrexate.  Advised patient to get annual influenza vaccine and to get a pneumococcal vaccine if patient has not already had one.  Patient declines vaccinations at this time.  She was counseled on the risk versus benefits of vaccinations. Provided patient with educational materials on methotrexate and answered all questions.  Patient consented to methotrexate use.  Will upload consent into the media tab.     Elisabeth Most, Pharm.D., BCPS Clinical Pharmacist Pager: [phone] Phone: [phone] 09/16/2016 12:11 PM

## 2016-10-14 ENCOUNTER — Ambulatory Visit (INDEPENDENT_AMBULATORY_CARE_PROVIDER_SITE_OTHER): Payer: Managed Care, Other (non HMO)

## 2016-10-14 VITALS — BP 126/78 | HR 68 | Resp 16 | Ht 68.0 in | Wt 263.0 lb

## 2016-10-14 DIAGNOSIS — Z3042 Encounter for surveillance of injectable contraceptive: Secondary | ICD-10-CM | POA: Diagnosis not present

## 2016-10-14 MED ORDER — MEDROXYPROGESTERONE ACETATE 150 MG/ML IM SUSP
150.0000 mg | Freq: Once | INTRAMUSCULAR | Status: AC
  Administered 2016-10-14: 150 mg via INTRAMUSCULAR

## 2016-10-14 NOTE — Progress Notes (Signed)
Patient is here for Depo Provera Injection Patient is within Depo Provera Calender Limits 2/22-3/8 Next Depo Due between: 5/7-5/21 Last AEX: 06/05/16 BS AEX Scheduled: not scheduled  Patient received last depo with PCP at [facility]. This has been verified with nurse at [facility] by [place]. Alroy Dust, [place]  Patient is aware when next depo is due  Pt tolerated Injection well in RUOQ.  Routed to provider for review, encounter closed.

## 2016-11-11 NOTE — Progress Notes (Deleted)
Office Visit Note  Patient: Joyce Zhang             Date of Birth: 1971-07-08           MRN: [id number]             PCP: Bartholome Bill, MD Referring: Bartholome Bill, MD Visit Date: 11/14/2016 Occupation: _0 @    Subjective:  No chief complaint on file.   History of Present Illness: Snow Peoples is a 46 y.o. female ***   Activities of Daily Living:  Patient reports morning stiffness for *** {minute/hour:19697}.   Patient {ACTIONS;DENIES/REPORTS:21021675::"Denies"} nocturnal pain.  Difficulty dressing/grooming: {ACTIONS;DENIES/REPORTS:21021675::"Denies"} Difficulty climbing stairs: {ACTIONS;DENIES/REPORTS:21021675::"Denies"} Difficulty getting out of chair: {ACTIONS;DENIES/REPORTS:21021675::"Denies"} Difficulty using hands for taps, buttons, cutlery, and/or writing: {ACTIONS;DENIES/REPORTS:21021675::"Denies"}   No Rheumatology ROS completed.   PMFS History:  Patient Active Problem List   Diagnosis Date Noted  . Leukocytoclastic vasculitis ([place]) 09/16/2016  . Alcohol use 09/13/2016  . Vasculitis on skin biopsy ([place]) 07/02/2016  . Episcleritis of both eyes 07/02/2016  . High risk medication use 07/02/2016  . Asthma 07/02/2016  . Allergic rhinitis 07/02/2016  . Noncompliance 07/02/2016  . History of DVT (deep vein thrombosis) 07/02/2016  . OP (osteoporosis) 07/02/2016  . Smoker 07/02/2016    Past Medical History:  Diagnosis Date  . Dysmenorrhea   . Episcleritis of both eyes   . Fibroid   . H/O vasculitis    in legs    Family History  Problem Relation Age of Onset  . Adopted: Yes  . Diabetes Mother   . Asthma Mother   . Breast cancer Mother   . Diabetes Maternal Grandmother   . Asthma Maternal Grandmother   . Breast cancer Maternal Grandmother   . Diabetes Maternal Grandfather    Past Surgical History:  Procedure Laterality Date  . broken foot Right   . fracture of index finger Left    Social History   Social History Narrative  .  No narrative on file     Objective: Vital Signs: There were no vitals taken for this visit.   Physical Exam   Musculoskeletal Exam: ***  CDAI Exam: No CDAI exam completed.    Investigation: Findings:  Hepatitis B and C: negative (05/17/15) HIV: negative (05/17/15) Chest-xray:  Baseline X-ray from 2007 TB gold negative (07/04/2016)   No visits with results within 3 Month(s) from this visit.  Latest known visit with results is:  Office Visit on 07/04/2016  Component Date Value Ref Range Status  . Sodium 07/04/2016 138  135 - 146 mmol/L Final  . Potassium 07/04/2016 4.3  3.5 - 5.3 mmol/L Final  . Chloride 07/04/2016 105  98 - 110 mmol/L Final  . CO2 07/04/2016 22  20 - 31 mmol/L Final  . Glucose, Bld 07/04/2016 101* 65 - 99 mg/dL Final  . BUN 07/04/2016 10  7 - 25 mg/dL Final  . Creat 07/04/2016 0.81  0.50 - 1.10 mg/dL Final  . Total Bilirubin 07/04/2016 0.4  0.2 - 1.2 mg/dL Final  . Alkaline Phosphatase 07/04/2016 61  33 - 115 U/L Final  . AST 07/04/2016 19  10 - 30 U/L Final  . ALT 07/04/2016 19  6 - 29 U/L Final  . Total Protein 07/04/2016 7.9  6.1 - 8.1 g/dL Final  . Albumin 07/04/2016 4.5  3.6 - 5.1 g/dL Final  . Calcium 07/04/2016 9.4  8.6 - 10.2 mg/dL Final  . GFR, Est African American 07/04/2016 >89  >=60 mL/min Final  .  GFR, Est Non African American 07/04/2016 89  >=60 mL/min Final  . Color, Urine 07/04/2016 DARK YELLOW  YELLOW Final  . APPearance 07/04/2016 CLEAR  CLEAR Final  . Specific Gravity, Urine 07/04/2016 1.024  1.001 - 1.035 Final  . pH 07/04/2016 6.0  5.0 - 8.0 Final  . Glucose, UA 07/04/2016 NEGATIVE  NEGATIVE Final  . Bilirubin Urine 07/04/2016 NEGATIVE  NEGATIVE Final  . Ketones, ur 07/04/2016 NEGATIVE  NEGATIVE Final  . Hgb urine dipstick 07/04/2016 NEGATIVE  NEGATIVE Final  . Protein, ur 07/04/2016 1+* NEGATIVE Final  . Nitrite 07/04/2016 NEGATIVE  NEGATIVE Final  . Leukocytes, UA 07/04/2016 NEGATIVE  NEGATIVE Final  . Sed Rate 07/04/2016 14  0  - 20 mm/hr Final  . TSH 07/04/2016 0.59  mIU/L Final   Comment:   Reference Range   > or = 20 Years  0.40-4.50   Pregnancy Range First trimester  0.26-2.66 Second trimester 0.55-2.73 Third trimester  0.43-2.91     . Anit Nuclear Antibody(ANA) 07/04/2016 NEG  NEGATIVE Final  . Total CK 07/04/2016 200* 7 - 177 U/L Final  . CK, MB 07/04/2016 <0.7  0.0 - 5.0 ng/mL Final  . Relative Index 07/04/2016 SEE NOTE  0.0 - 4.0 Final   Comment:   Result not calculated because one or more required values exceed analytical limits.     Marland Kitchen ANCA Screen 07/04/2016 Negative   Final   Comment:             ** Normal Reference Range: Negative **   ANCA Screen includes evaluation for p-ANCA, c-ANCA and Atypical p-ANCA.   Marland Kitchen Myeloperoxidase Abs 07/04/2016 <1.0  <1.0 AI Final   Comment:                                 Value   Interpretation                              <1.0 AI: No Antibody Detected                           >or=1.0 AI: Antibody Detected   Autoantibodies to myeloperoxidase (MPO) are commonly associated with the following small-vessel vasculitides: microscopic polyangiitis, polyarteritis nodosa, Churg-Strauss syndrome, necrotizing and crescentic glomerulonephritis and occasionally Wegener's granulomatosis. The perinuclear IFA pattern, (p-ANCA), is based largely on autoantibody to myeloperoxidase which serves as the primary antigen   These autoantibodies are present in active disease state.     . Serine Protease 3 07/04/2016 <1.0  <1.0 AI Final   Comment:                                 Value   Interpretation                              <1.0 AI: No Antibody Detected                           >or=1.0 AI: Antibody Detected   Autoantibodies to proteinase-3 (PR-3) are accepted as characteristic for granulomatosis with polyangiitis (Wegener's), and are detectable in 95% of the histologically proven cases. The cytoplasmic IFA pattern, (c-ANCA), is based largely on autoantibody  to PR-3  which serves as the primary antigen.   These autoantibodies are present in active disease state.     Marland Kitchen Rhuematoid fact SerPl-aCnc 07/04/2016 <14  <14 IU/mL Final  . Cyclic Citrullin Peptide Ab 07/04/2016 <16  Units Final   Comment:   Reference Range Negative               < 20 Weak Positive            20 - 39 Moderate Positive        40 - 59 Strong Positive        > 59   . Total Protein, Serum Electrophores* 07/04/2016 7.9  6.1 - 8.1 g/dL Final  . Albumin ELP 07/04/2016 4.3  3.8 - 4.8 g/dL Final  . Alpha-1-Globulin 07/04/2016 0.4* 0.2 - 0.3 g/dL Final  . Alpha-2-Globulin 07/04/2016 1.0* 0.5 - 0.9 g/dL Final  . Beta Globulin 07/04/2016 0.4  0.4 - 0.6 g/dL Final  . Beta 2 07/04/2016 0.5  0.2 - 0.5 g/dL Final  . Gamma Globulin 07/04/2016 1.2  0.8 - 1.7 g/dL Final  . Abnormal Protein Band1 07/04/2016 NOT DET  g/dL Final  . SPE Interp. 07/04/2016 SEE NOTE   Final   Comment: Evaluation is consistent with an acute inflammatory pattern. Reviewed by Odis Hollingshead, MD, PhD, FCAP (Electronic Signature on File)   . Abnormal Protein Band2 07/04/2016 NOT DET  g/dL Final  . Abnormal Protein Band3 07/04/2016 NOT DET  g/dL Final  . IgG (Immunoglobin G), Serum 07/04/2016 1319  694 - 1,618 mg/dL Final  . IgA 07/04/2016 217  81 - 463 mg/dL Final  . IgM, Serum 07/04/2016 139  48 - 271 mg/dL Final  . Interferon Gamma Release Assay 07/04/2016 NEGATIVE  NEGATIVE Final  . Quantiferon Nil Value 07/04/2016 0.04  IU/mL Final  . Mitogen-Nil 07/04/2016 7.04  IU/mL Final  . Quantiferon Tb Ag Minus Nil Value 07/04/2016 0.00  IU/mL Final   Comment:   The Nil tube value is used to determine if the patient has a preexisting immune response which could cause a false-positive reading on the test. In order for a test to be valid, the Nil tube must have a value of less than or equal to 8.0 IU/mL.   The mitogen control tube is used to assure the patient has a healthy immune status and also serves  as a control for correct blood handling and incubation. It is used to detect false-negative readings. The mitogen tube must have a gamma interferon value of greater than or equal to 0.5 IU/mL higher than the value of the Nil tube.   The TB antigen tube is coated with the M. tuberculosis specific antigens. For a test to be considered positive, the TB antigen tube value minus the Nil tube value must be greater than or equal to 0.35 IU/mL.   For additional information, please refer to http://education.questdiagnostics.com/faq/QFT (This link is being provided for informational/educational purposes only.)   . WBC 07/04/2016 3.2* 3.8 - 10.8 K/uL Final  . RBC 07/04/2016 4.61  3.80 - 5.10 MIL/uL Final  . Hemoglobin 07/04/2016 13.8  11.7 - 15.5 g/dL Final  . HCT 07/04/2016 41.6  35.0 - 45.0 % Final  . MCV 07/04/2016 90.2  80.0 - 100.0 fL Final  . MCH 07/04/2016 29.9  27.0 - 33.0 pg Final  . MCHC 07/04/2016 33.2  32.0 - 36.0 g/dL Final  . RDW 07/04/2016 13.7  11.0 - 15.0 % Final  . Platelets 07/04/2016 222  140 - 400 K/uL Final  . MPV 07/04/2016 10.6  7.5 - 12.5 fL Final  . Neutro Abs 07/04/2016 1792  1,500 - 7,800 cells/uL Final  . Lymphs Abs 07/04/2016 1024  850 - 3,900 cells/uL Final  . Monocytes Absolute 07/04/2016 224  200 - 950 cells/uL Final  . Eosinophils Absolute 07/04/2016 128  15 - 500 cells/uL Final  . Basophils Absolute 07/04/2016 32  0 - 200 cells/uL Final  . Neutrophils Relative % 07/04/2016 56  % Final  . Lymphocytes Relative 07/04/2016 32  % Final  . Monocytes Relative 07/04/2016 7  % Final  . Eosinophils Relative 07/04/2016 4  % Final  . Basophils Relative 07/04/2016 1  % Final  . Smear Review 07/04/2016 Criteria for review not met   Final  . WBC, UA 07/04/2016 0-5  <=5 WBC/HPF Final  . RBC / HPF 07/04/2016 0-2  <=2 RBC/HPF Final  . Squamous Epithelial / LPF 07/04/2016 0-5  <=5 HPF Final  . Bacteria, UA 07/04/2016 NONE SEEN  NONE SEEN HPF Final  . Crystals 07/04/2016  NONE SEEN  NONE SEEN HPF Final  . Casts 07/04/2016 NONE SEEN  NONE SEEN LPF Final  . Yeast 07/04/2016 NONE SEEN  NONE SEEN HPF Final     Imaging: No results found.  Speciality Comments: No specialty comments available.    Procedures:  No procedures performed Allergies: Patient has no known allergies.   Assessment / Plan:     Visit Diagnoses: Leukocytoclastic vasculitis ([place]) - Positive for skin biopsy  Episcleritis of both eyes  High risk medication use  Noncompliance  Other osteoporosis without current pathological fracture  History of DVT (deep vein thrombosis)  Smoker  Alcohol use  History of asthma    Orders: No orders of the defined types were placed in this encounter.  No orders of the defined types were placed in this encounter.   Face-to-face time spent with patient was *** minutes. 50% of time was spent in counseling and coordination of care.  Follow-Up Instructions: No Follow-up on file.   Bo Merino, MD  Note - This record has been created using Editor, commissioning.  Chart creation errors have been sought, but may not always  have been located. Such creation errors do not reflect on  the standard of medical care.

## 2016-11-14 ENCOUNTER — Ambulatory Visit: Payer: Managed Care, Other (non HMO) | Admitting: Rheumatology

## 2016-12-03 NOTE — Progress Notes (Deleted)
Office Visit Note  Patient: Joyce Zhang             Date of Birth: 1971/05/15           MRN: [id number]             PCP: Bartholome Bill, MD Referring: Bartholome Bill, MD Visit Date: 12/12/2016 Occupation: _0 @    Subjective:  No chief complaint on file.   History of Present Illness: Joyce Zhang is a 46 y.o. female ***   Activities of Daily Living:  Patient reports morning stiffness for *** {minute/hour:19697}.   Patient {ACTIONS;DENIES/REPORTS:21021675::"Denies"} nocturnal pain.  Difficulty dressing/grooming: {ACTIONS;DENIES/REPORTS:21021675::"Denies"} Difficulty climbing stairs: {ACTIONS;DENIES/REPORTS:21021675::"Denies"} Difficulty getting out of chair: {ACTIONS;DENIES/REPORTS:21021675::"Denies"} Difficulty using hands for taps, buttons, cutlery, and/or writing: {ACTIONS;DENIES/REPORTS:21021675::"Denies"}   No Rheumatology ROS completed.   PMFS History:  Patient Active Problem List   Diagnosis Date Noted  . Leukocytoclastic vasculitis ([place]) 09/16/2016  . Alcohol use 09/13/2016  . Vasculitis on skin biopsy ([place]) 07/02/2016  . Episcleritis of both eyes 07/02/2016  . High risk medication use 07/02/2016  . Asthma 07/02/2016  . Allergic rhinitis 07/02/2016  . Noncompliance 07/02/2016  . History of DVT (deep vein thrombosis) 07/02/2016  . OP (osteoporosis) 07/02/2016  . Smoker 07/02/2016    Past Medical History:  Diagnosis Date  . Dysmenorrhea   . Episcleritis of both eyes   . Fibroid   . H/O vasculitis    in legs    Family History  Problem Relation Age of Onset  . Adopted: Yes  . Diabetes Mother   . Asthma Mother   . Breast cancer Mother   . Diabetes Maternal Grandmother   . Asthma Maternal Grandmother   . Breast cancer Maternal Grandmother   . Diabetes Maternal Grandfather    Past Surgical History:  Procedure Laterality Date  . broken foot Right   . fracture of index finger Left    Social History   Social History Narrative  .  No narrative on file     Objective: Vital Signs: There were no vitals taken for this visit.   Physical Exam   Musculoskeletal Exam: ***  CDAI Exam: No CDAI exam completed.    Investigation: No additional findings.  No visits with results within 3 Month(s) from this visit.  Latest known visit with results is:  Office Visit on 07/04/2016  Component Date Value Ref Range Status  . Sodium 07/04/2016 138  135 - 146 mmol/L Final  . Potassium 07/04/2016 4.3  3.5 - 5.3 mmol/L Final  . Chloride 07/04/2016 105  98 - 110 mmol/L Final  . CO2 07/04/2016 22  20 - 31 mmol/L Final  . Glucose, Bld 07/04/2016 101* 65 - 99 mg/dL Final  . BUN 07/04/2016 10  7 - 25 mg/dL Final  . Creat 07/04/2016 0.81  0.50 - 1.10 mg/dL Final  . Total Bilirubin 07/04/2016 0.4  0.2 - 1.2 mg/dL Final  . Alkaline Phosphatase 07/04/2016 61  33 - 115 U/L Final  . AST 07/04/2016 19  10 - 30 U/L Final  . ALT 07/04/2016 19  6 - 29 U/L Final  . Total Protein 07/04/2016 7.9  6.1 - 8.1 g/dL Final  . Albumin 07/04/2016 4.5  3.6 - 5.1 g/dL Final  . Calcium 07/04/2016 9.4  8.6 - 10.2 mg/dL Final  . GFR, Est African American 07/04/2016 >89  >=60 mL/min Final  . GFR, Est Non African American 07/04/2016 89  >=60 mL/min Final  . Color, Urine 07/04/2016 DARK YELLOW  YELLOW Final  . APPearance 07/04/2016 CLEAR  CLEAR Final  . Specific Gravity, Urine 07/04/2016 1.024  1.001 - 1.035 Final  . pH 07/04/2016 6.0  5.0 - 8.0 Final  . Glucose, UA 07/04/2016 NEGATIVE  NEGATIVE Final  . Bilirubin Urine 07/04/2016 NEGATIVE  NEGATIVE Final  . Ketones, ur 07/04/2016 NEGATIVE  NEGATIVE Final  . Hgb urine dipstick 07/04/2016 NEGATIVE  NEGATIVE Final  . Protein, ur 07/04/2016 1+* NEGATIVE Final  . Nitrite 07/04/2016 NEGATIVE  NEGATIVE Final  . Leukocytes, UA 07/04/2016 NEGATIVE  NEGATIVE Final  . Sed Rate 07/04/2016 14  0 - 20 mm/hr Final  . TSH 07/04/2016 0.59  mIU/L Final   Comment:   Reference Range   > or = 20 Years  0.40-4.50     Pregnancy Range First trimester  0.26-2.66 Second trimester 0.55-2.73 Third trimester  0.43-2.91     . Anit Nuclear Antibody(ANA) 07/04/2016 NEG  NEGATIVE Final  . Total CK 07/04/2016 200* 7 - 177 U/L Final  . CK, MB 07/04/2016 <0.7  0.0 - 5.0 ng/mL Final  . Relative Index 07/04/2016 SEE NOTE  0.0 - 4.0 Final   Comment:   Result not calculated because one or more required values exceed analytical limits.     Marland Kitchen ANCA Screen 07/04/2016 Negative   Final   Comment:             ** Normal Reference Range: Negative **   ANCA Screen includes evaluation for p-ANCA, c-ANCA and Atypical p-ANCA.   Marland Kitchen Myeloperoxidase Abs 07/04/2016 <1.0  <1.0 AI Final   Comment:                                 Value   Interpretation                              <1.0 AI: No Antibody Detected                           >or=1.0 AI: Antibody Detected   Autoantibodies to myeloperoxidase (MPO) are commonly associated with the following small-vessel vasculitides: microscopic polyangiitis, polyarteritis nodosa, Churg-Strauss syndrome, necrotizing and crescentic glomerulonephritis and occasionally Wegener's granulomatosis. The perinuclear IFA pattern, (p-ANCA), is based largely on autoantibody to myeloperoxidase which serves as the primary antigen   These autoantibodies are present in active disease state.     . Serine Protease 3 07/04/2016 <1.0  <1.0 AI Final   Comment:                                 Value   Interpretation                              <1.0 AI: No Antibody Detected                           >or=1.0 AI: Antibody Detected   Autoantibodies to proteinase-3 (PR-3) are accepted as characteristic for granulomatosis with polyangiitis (Wegener's), and are detectable in 95% of the histologically proven cases. The cytoplasmic IFA pattern, (c-ANCA), is based largely on autoantibody to PR-3 which serves as the primary antigen.   These autoantibodies are present in active disease state.     Marland Kitchen  Rhuematoid fact SerPl-aCnc 07/04/2016 <14  <14 IU/mL Final  . Cyclic Citrullin Peptide Ab 07/04/2016 <16  Units Final   Comment:   Reference Range Negative               < 20 Weak Positive            20 - 39 Moderate Positive        40 - 59 Strong Positive        > 59   . Total Protein, Serum Electrophores* 07/04/2016 7.9  6.1 - 8.1 g/dL Final  . Albumin ELP 07/04/2016 4.3  3.8 - 4.8 g/dL Final  . Alpha-1-Globulin 07/04/2016 0.4* 0.2 - 0.3 g/dL Final  . Alpha-2-Globulin 07/04/2016 1.0* 0.5 - 0.9 g/dL Final  . Beta Globulin 07/04/2016 0.4  0.4 - 0.6 g/dL Final  . Beta 2 07/04/2016 0.5  0.2 - 0.5 g/dL Final  . Gamma Globulin 07/04/2016 1.2  0.8 - 1.7 g/dL Final  . Abnormal Protein Band1 07/04/2016 NOT DET  g/dL Final  . SPE Interp. 07/04/2016 SEE NOTE   Final   Comment: Evaluation is consistent with an acute inflammatory pattern. Reviewed by Odis [place], MD, PhD, FCAP (Electronic Signature on File)   . Abnormal Protein Band2 07/04/2016 NOT DET  g/dL Final  . Abnormal Protein Band3 07/04/2016 NOT DET  g/dL Final  . IgG (Immunoglobin G), Serum 07/04/2016 1319  694 - 1,618 mg/dL Final  . IgA 07/04/2016 217  81 - 463 mg/dL Final  . IgM, Serum 07/04/2016 139  48 - 271 mg/dL Final  . Interferon Gamma Release Assay 07/04/2016 NEGATIVE  NEGATIVE Final  . Quantiferon Nil Value 07/04/2016 0.04  IU/mL Final  . Mitogen-Nil 07/04/2016 7.04  IU/mL Final  . Quantiferon Tb Ag Minus Nil Value 07/04/2016 0.00  IU/mL Final   Comment:   The Nil tube value is used to determine if the patient has a preexisting immune response which could cause a false-positive reading on the test. In order for a test to be valid, the Nil tube must have a value of less than or equal to 8.0 IU/mL.   The mitogen control tube is used to assure the patient has a healthy immune status and also serves as a control for correct blood handling and incubation. It is used to detect false-negative readings. The mitogen  tube must have a gamma interferon value of greater than or equal to 0.5 IU/mL higher than the value of the Nil tube.   The TB antigen tube is coated with the M. tuberculosis specific antigens. For a test to be considered positive, the TB antigen tube value minus the Nil tube value must be greater than or equal to 0.35 IU/mL.   For additional information, please refer to http://education.[facility].com/faq/QFT (This link is being provided for informational/educational purposes only.)   . WBC 07/04/2016 3.2* 3.8 - 10.8 K/uL Final  . RBC 07/04/2016 4.61  3.80 - 5.10 MIL/uL Final  . Hemoglobin 07/04/2016 13.8  11.7 - 15.5 g/dL Final  . HCT 07/04/2016 41.6  35.0 - 45.0 % Final  . MCV 07/04/2016 90.2  80.0 - 100.0 fL Final  . MCH 07/04/2016 29.9  27.0 - 33.0 pg Final  . MCHC 07/04/2016 33.2  32.0 - 36.0 g/dL Final  . RDW 07/04/2016 13.7  11.0 - 15.0 % Final  . Platelets 07/04/2016 222  140 - 400 K/uL Final  . MPV 07/04/2016 10.6  7.5 - 12.5 fL Final  . Neutro Abs 07/04/2016  1792  1,500 - 7,800 cells/uL Final  . Lymphs Abs 07/04/2016 1024  850 - 3,900 cells/uL Final  . Monocytes Absolute 07/04/2016 224  200 - 950 cells/uL Final  . Eosinophils Absolute 07/04/2016 128  15 - 500 cells/uL Final  . Basophils Absolute 07/04/2016 32  0 - 200 cells/uL Final  . Neutrophils Relative % 07/04/2016 56  % Final  . Lymphocytes Relative 07/04/2016 32  % Final  . Monocytes Relative 07/04/2016 7  % Final  . Eosinophils Relative 07/04/2016 4  % Final  . Basophils Relative 07/04/2016 1  % Final  . Smear Review 07/04/2016 Criteria for review not met   Final  . WBC, UA 07/04/2016 0-5  <=5 WBC/HPF Final  . RBC / HPF 07/04/2016 0-2  <=2 RBC/HPF Final  . Squamous Epithelial / LPF 07/04/2016 0-5  <=5 HPF Final  . Bacteria, UA 07/04/2016 NONE SEEN  NONE SEEN HPF Final  . Crystals 07/04/2016 NONE SEEN  NONE SEEN HPF Final  . Casts 07/04/2016 NONE SEEN  NONE SEEN LPF Final  . Yeast 07/04/2016 NONE SEEN   NONE SEEN HPF Final    Imaging: No results found.  Speciality Comments: No specialty comments available.    Procedures:  No procedures performed Allergies: Patient has no known allergies.   Assessment / Plan:     Visit Diagnoses: Leukocytoclastic vasculitis ([place]) - Positive skin biopsy  Episcleritis of both eyes  High risk medication use - Methotrexate, folic acid  Other osteoporosis without current pathological fracture  History of DVT (deep vein thrombosis)  Alcohol use  Smoker  Noncompliance  History of asthma    Orders: No orders of the defined types were placed in this encounter.  No orders of the defined types were placed in this encounter.   Face-to-face time spent with patient was *** minutes. 50% of time was spent in counseling and coordination of care.  Follow-Up Instructions: No Follow-up on file.   Bo Merino, MD  Note - This record has been created using Editor, commissioning.  Chart creation errors have been sought, but may not always  have been located. Such creation errors do not reflect on  the standard of medical care.

## 2016-12-12 ENCOUNTER — Ambulatory Visit: Payer: Managed Care, Other (non HMO) | Admitting: Rheumatology

## 2016-12-16 ENCOUNTER — Other Ambulatory Visit: Payer: Self-pay | Admitting: Rheumatology

## 2016-12-17 NOTE — Telephone Encounter (Signed)
I called patient to see if she is taking this medication She has not had ANY labs since starting the Methotrexate. She does express frustration over high bills for labs, but does understand we need to monitor her liver functions, kidney functions and blood counts  She will come in on Friday for the labs, which is when her next dose is  Ok to send in a month supply of this medicine, or do we need labs first? I have advised her likely we will need labs first and she states she understands.

## 2016-12-17 NOTE — Telephone Encounter (Signed)
Yes, we need labs first

## 2016-12-20 ENCOUNTER — Other Ambulatory Visit: Payer: Self-pay | Admitting: Radiology

## 2016-12-20 DIAGNOSIS — Z79899 Other long term (current) drug therapy: Secondary | ICD-10-CM

## 2016-12-20 LAB — CBC WITH DIFFERENTIAL/PLATELET
Basophils Absolute: 0 {cells}/uL (ref 0–200)
Basophils Relative: 0 %
Eosinophils Absolute: 189 {cells}/uL (ref 15–500)
Eosinophils Relative: 7 %
HCT: 39.8 % (ref 35.0–45.0)
Hemoglobin: 13.3 g/dL (ref 11.7–15.5)
Lymphocytes Relative: 37 %
Lymphs Abs: 999 {cells}/uL (ref 850–3900)
MCH: 30.9 pg (ref 27.0–33.0)
MCHC: 33.4 g/dL (ref 32.0–36.0)
MCV: 92.3 fL (ref 80.0–100.0)
MPV: 10 fL (ref 7.5–12.5)
Monocytes Absolute: 216 {cells}/uL (ref 200–950)
Monocytes Relative: 8 %
Neutro Abs: 1296 {cells}/uL — ABNORMAL LOW (ref 1500–7800)
Neutrophils Relative %: 48 %
Platelets: 201 K/uL (ref 140–400)
RBC: 4.31 MIL/uL (ref 3.80–5.10)
RDW: 14 % (ref 11.0–15.0)
WBC: 2.7 K/uL — ABNORMAL LOW (ref 3.8–10.8)

## 2016-12-21 LAB — COMPLETE METABOLIC PANEL WITH GFR
AG Ratio: 1.4 Ratio (ref 1.0–2.5)
ALT: 14 U/L (ref 6–29)
AST: 13 U/L (ref 10–30)
Albumin: 4.1 g/dL (ref 3.6–5.1)
Alkaline Phosphatase: 47 U/L (ref 33–115)
BUN/Creatinine Ratio: 16.5 Ratio (ref 6–22)
BUN: 14 mg/dL (ref 7–25)
CO2: 22 mmol/L (ref 20–31)
Calcium: 9 mg/dL (ref 8.6–10.2)
Chloride: 104 mmol/L (ref 98–110)
Creat: 0.85 mg/dL (ref 0.50–1.10)
GFR, Est African American: >89 mL/min (ref >=60)
GFR, Est Non African American: 84 mL/min (ref >=60)
Globulin: 2.9 g/dL (ref 1.9–3.7)
Glucose, Bld: 97 mg/dL (ref 65–99)
Potassium: 4.5 mmol/L (ref 3.5–5.3)
Sodium: 138 mmol/L (ref 135–146)
Total Bilirubin: 0.4 mg/dL (ref 0.2–1.2)
Total Protein: 7 g/dL (ref 6.1–8.1)

## 2016-12-21 NOTE — Progress Notes (Signed)
Please find out what dose off methotrexate she is on.We may have to reduce the dose of methotrexate and recheck labs in 2 weeks after reduced dose.

## 2016-12-23 ENCOUNTER — Telehealth: Payer: Self-pay | Admitting: Radiology

## 2016-12-23 MED ORDER — METHOTREXATE 2.5 MG PO TABS: ORAL_TABLET | ORAL | 0 refills | Status: DC

## 2016-12-23 NOTE — Progress Notes (Signed)
Reduce MTx to 6 tabs po q week

## 2016-12-23 NOTE — Telephone Encounter (Signed)
Called patient to advise her to cut back on the MTX to 6.week dose, sent in for her.   She has voiced understanding.

## 2016-12-23 NOTE — Telephone Encounter (Signed)
-----   Message from Bo Merino, MD sent at 12/23/2016  1:11 PM EDT ----- Reduce MTx to 6 tabs po q week

## 2016-12-25 NOTE — Progress Notes (Signed)
Office Visit Note  Patient: Joyce Zhang             Date of Birth: 1971-07-09           MRN: [id number]             PCP: Bartholome Bill, MD Referring: Bartholome Bill, MD Visit Date: 12/31/2016 Occupation: '@GUAROCC'$ @    Subjective:  Rash  History of Present Illness: [name] is a 46 y.o. female with a history of leukocytoclastic vasculitis. Patient reports that she developed some skin infection under her nose for which she was seen by dermatologist. She was on antibiotics for 1 month. She was treated with Keflex for that. She ran out of methotrexate torsi and of the month. She developed a rash on her right lower extremity about 2 weeks ago. She resumed the methotrexate and now the rash is resolved.  Activities of Daily Living:  Patient reports morning stiffness for 0 minutes.   Patient Denies nocturnal pain.  Difficulty dressing/grooming: Denies Difficulty climbing stairs: Denies Difficulty getting out of chair: Denies Difficulty using hands for taps, buttons, cutlery, and/or writing: Denies   Review of Systems  Constitutional: Negative for fatigue, weight gain, weight loss and weakness.  HENT: Negative for mouth sores, mouth dryness and nose dryness.   Eyes: Negative for pain, redness and dryness.  Respiratory: Negative for cough, shortness of breath and difficulty breathing.   Cardiovascular: Negative for chest pain, palpitations, hypertension, irregular heartbeat and swelling in legs/feet.  Gastrointestinal: Negative for blood in stool, constipation and diarrhea.  Genitourinary: Negative for painful urination.  Musculoskeletal: Negative for arthralgias, joint pain, joint swelling, myalgias, morning stiffness, muscle tenderness and myalgias.  Skin: Positive for rash. Negative for color change, nodules/bumps, skin tightness and ulcers.  Allergic/Immunologic: Negative for susceptible to infections.  Neurological: Negative for dizziness and headaches.    Hematological: Negative for swollen glands.  Psychiatric/Behavioral: Negative for depressed mood and sleep disturbance. The patient is not nervous/anxious.     PMFS History:  Patient Active Problem List   Diagnosis Date Noted  . Leukocytoclastic vasculitis ([place]) 09/16/2016  . Alcohol use 09/13/2016  . Vasculitis on skin biopsy ([place]) 07/02/2016  . Episcleritis of both eyes 07/02/2016  . High risk medication use 07/02/2016  . Asthma 07/02/2016  . Allergic rhinitis 07/02/2016  . Noncompliance 07/02/2016  . History of DVT (deep vein thrombosis) 07/02/2016  . OP (osteoporosis) 07/02/2016  . Smoker 07/02/2016    Past Medical History:  Diagnosis Date  . Dysmenorrhea   . Episcleritis of both eyes   . Fibroid   . H/O vasculitis    in legs  . Scleritis     Family History  Problem Relation Age of Onset  . Adopted: Yes  . Diabetes Mother   . Asthma Mother   . Breast cancer Mother   . Diabetes Maternal Grandmother   . Asthma Maternal Grandmother   . Breast cancer Maternal Grandmother   . Diabetes Maternal Grandfather    Past Surgical History:  Procedure Laterality Date  . broken foot Right   . fracture of index finger Left    Social History   Social History Narrative  . No narrative on file     Objective: Vital Signs: BP 130/82   Pulse 62   Resp 14   Ht '5\' 8"'$  (1.727 m)   Wt 254 lb (115.2 kg)   BMI 38.62 kg/m    Physical Exam  Constitutional: She is oriented to person, place, and  time. She appears well-developed and well-nourished.  HENT:  Head: Normocephalic and atraumatic.  Eyes: Conjunctivae and EOM are normal.  Neck: Normal range of motion.  Cardiovascular: Normal rate, regular rhythm, normal heart sounds and intact distal pulses.   Pulmonary/Chest: Effort normal and breath sounds normal.  Abdominal: Soft. Bowel sounds are normal.  Lymphadenopathy:    She has no cervical adenopathy.  Neurological: She is alert and oriented to person, place, and time.   Skin: Skin is warm and dry. Capillary refill takes less than 2 seconds.  Hypopigmented lesions on the lower extremities, no active lesion.  Psychiatric: She has a normal mood and affect. Her behavior is normal.  Nursing note and vitals reviewed.    Musculoskeletal Exam: C-spine and thoracic lumbar spine good range of motion. Shoulder joints although joints wrist joint MCPs PIPs DIPs with good range of motion with no synovitis. Hip joints knee joints ankles MTPs PIPs DIPs are good range of motion with no synovitis.  CDAI Exam: No CDAI exam completed.    Investigation: No additional findings.  Orders Only on 12/20/2016  Component Date Value Ref Range Status  . WBC 12/20/2016 2.7* 3.8 - 10.8 K/uL Final  . RBC 12/20/2016 4.31  3.80 - 5.10 MIL/uL Final  . Hemoglobin 12/20/2016 13.3  11.7 - 15.5 g/dL Final  . HCT 25/98/3788 39.8  35.0 - 45.0 % Final  . MCV 12/20/2016 92.3  80.0 - 100.0 fL Final  . MCH 12/20/2016 30.9  27.0 - 33.0 pg Final  . MCHC 12/20/2016 33.4  32.0 - 36.0 g/dL Final  . RDW 99/64/9851 14.0  11.0 - 15.0 % Final  . Platelets 12/20/2016 201  140 - 400 K/uL Final  . MPV 12/20/2016 10.0  7.5 - 12.5 fL Final  . Neutro Abs 12/20/2016 1296* 1,500 - 7,800 cells/uL Final  . Lymphs Abs 12/20/2016 999  850 - 3,900 cells/uL Final  . Monocytes Absolute 12/20/2016 216  200 - 950 cells/uL Final  . Eosinophils Absolute 12/20/2016 189  15 - 500 cells/uL Final  . Basophils Absolute 12/20/2016 0  0 - 200 cells/uL Final  . Neutrophils Relative % 12/20/2016 48  % Final  . Lymphocytes Relative 12/20/2016 37  % Final  . Monocytes Relative 12/20/2016 8  % Final  . Eosinophils Relative 12/20/2016 7  % Final  . Basophils Relative 12/20/2016 0  % Final  . Smear Review 12/20/2016 Criteria for review not met   Final  . Sodium 12/20/2016 138  135 - 146 mmol/L Final  . Potassium 12/20/2016 4.5  3.5 - 5.3 mmol/L Final  . Chloride 12/20/2016 104  98 - 110 mmol/L Final  . CO2 12/20/2016 22  20 -  31 mmol/L Final  . Glucose, Bld 12/20/2016 97  65 - 99 mg/dL Final  . BUN 90/34/7451 14  7 - 25 mg/dL Final  . Creat 21/92/1742 0.85  0.50 - 1.10 mg/dL Final  . Total Bilirubin 12/20/2016 0.4  0.2 - 1.2 mg/dL Final  . Alkaline Phosphatase 12/20/2016 47  33 - 115 U/L Final  . AST 12/20/2016 13  10 - 30 U/L Final  . ALT 12/20/2016 14  6 - 29 U/L Final  . Total Protein 12/20/2016 7.0  6.1 - 8.1 g/dL Final  . Albumin 06/61/5111 4.1  3.6 - 5.1 g/dL Final  . Calcium 04/51/1047 9.0  8.6 - 10.2 mg/dL Final  . Globulin 57/47/1393 2.9  1.9 - 3.7 g/dL Final  . AG Ratio 20/42/8935 1.4  1.0 -  2.5 Ratio Final  . BUN/Creatinine Ratio 12/20/2016 16.5  6 - 22 Ratio Final  . GFR, Est African American 12/20/2016 >89  >=60 mL/min Final  . GFR, Est Non African American 12/20/2016 84  >=60 mL/min Final    Imaging: No results found.  Speciality Comments: No specialty comments available.    Procedures:  No procedures performed Allergies: Patient has no known allergies.   Assessment / Plan:     Visit Diagnoses: Leukocytoclastic vasculitis ([place]) - Positive skin biopsy. Patient developed a small area of rash on her right lower extremity while she was off methotrexate for a week. The rash is resolved now. We have reduced her methotrexate dose to 6 tablets per week due to neutropenia. We'll watch closely.  Episcleritis of both eyes: Have advised her to follow up with ophthalmologist. She's not seen ophthalmologist in a long time.  High risk medication use - Methotrexate 6 tablets by mouth every week, folic acid 2 mg by mouth daily, neutropenia on recent labs. She will get labs in a month and then every 3 months to monitor for drug toxicity.  Noncompliance - Treated with methotrexate intermittently until 2013, several courses of prednisone in the past  Other osteoporosis without current pathological fracture  History of DVT (deep vein thrombosis)  Alcohol use: Patient record reports reduced consumption  of alcohol. Abstinence from alcohol was emphasized.  Smoker: Smoking cessation was discussed.  History of asthma    Orders: No orders of the defined types were placed in this encounter.  No orders of the defined types were placed in this encounter.   Face-to-face time spent with patient was 30 minutes. 50% of time was spent in counseling and coordination of care.  Follow-Up Instructions: Return in about 5 months (around 06/02/2017) for Vasculitis.   Bo Merino, MD  Note - This record has been created using Editor, commissioning.  Chart creation errors have been sought, but may not always  have been located. Such creation errors do not reflect on  the standard of medical care.

## 2016-12-31 ENCOUNTER — Encounter: Payer: Self-pay | Admitting: Rheumatology

## 2016-12-31 ENCOUNTER — Ambulatory Visit (INDEPENDENT_AMBULATORY_CARE_PROVIDER_SITE_OTHER): Payer: Managed Care, Other (non HMO) | Admitting: Rheumatology

## 2016-12-31 VITALS — BP 130/82 | HR 62 | Resp 14 | Ht 68.0 in | Wt 254.0 lb

## 2016-12-31 DIAGNOSIS — M818 Other osteoporosis without current pathological fracture: Secondary | ICD-10-CM

## 2016-12-31 DIAGNOSIS — Z9119 Patient's noncompliance with other medical treatment and regimen: Secondary | ICD-10-CM

## 2016-12-31 DIAGNOSIS — Z8709 Personal history of other diseases of the respiratory system: Secondary | ICD-10-CM

## 2016-12-31 DIAGNOSIS — Z86718 Personal history of other venous thrombosis and embolism: Secondary | ICD-10-CM

## 2016-12-31 DIAGNOSIS — M310 Hypersensitivity angiitis: Secondary | ICD-10-CM

## 2016-12-31 DIAGNOSIS — Z789 Other specified health status: Secondary | ICD-10-CM | POA: Diagnosis not present

## 2016-12-31 DIAGNOSIS — F17200 Nicotine dependence, unspecified, uncomplicated: Secondary | ICD-10-CM

## 2016-12-31 DIAGNOSIS — H15103 Unspecified episcleritis, bilateral: Secondary | ICD-10-CM | POA: Diagnosis not present

## 2016-12-31 DIAGNOSIS — Z91199 Patient's noncompliance with other medical treatment and regimen due to unspecified reason: Secondary | ICD-10-CM

## 2016-12-31 DIAGNOSIS — Z79899 Other long term (current) drug therapy: Secondary | ICD-10-CM | POA: Diagnosis not present

## 2016-12-31 DIAGNOSIS — Z7289 Other problems related to lifestyle: Secondary | ICD-10-CM

## 2016-12-31 NOTE — Patient Instructions (Signed)
Standing Labs We placed an order today for your standing lab work.    Please come back and get your standing labs in June and every 3 months  We have open lab Monday through Friday from 8:30-11:30 AM and 1:30-4 PM at the office of Dr. Shaili Deveshwar/Naitik Panwala, PA.   The office is located at [address], [address], [place], [place] [address] No appointment is necessary.   Labs are drawn by [facility].  You may receive a bill from [facility] for your lab work.    

## 2017-01-13 ENCOUNTER — Ambulatory Visit: Payer: Managed Care, Other (non HMO)

## 2017-01-13 NOTE — Progress Notes (Deleted)
Patient is here for Depo Provera Injection Patient is within Depo Provera Calender Limits 5/7-5/21 Next Depo Due between: 8/6-8/20 Last AEX: 06/05/16 BS AEX Scheduled: 06/13/17  Patient is aware when next depo is due  Pt tolerated Injection well in [place].  Routed to provider for review, encounter closed.

## 2017-01-15 ENCOUNTER — Telehealth: Payer: Self-pay | Admitting: Obstetrics and Gynecology

## 2017-01-15 NOTE — Telephone Encounter (Signed)
Left message to call Jill at [phone].  

## 2017-01-15 NOTE — Telephone Encounter (Signed)
Patient called and states she miss an appointment for a Depo injection. Patient would like to speak with a nurse about rescheduling this appointment.  Routing to Triage [employer]

## 2017-01-15 NOTE — Telephone Encounter (Signed)
Spoke with patient. Patient reports she missed depo provera injection due 12/30/16-01/13/17, would like to reschedule. Last depo given on 10/14/16. Patient rpeorts no chance of pregnancy, has female partner. Reports no cycles while on depo, LMP unknown. Advised patient would review with Dr. Quincy Simmonds for recommendations for scheduling and return call, patient is agreeable.  Dr. Quincy Simmonds, please advise on scheduling depo provera?

## 2017-01-15 NOTE — Telephone Encounter (Signed)
Ok to schedule and give Depo Provera to patient.  She is not at risk for pregnancy.

## 2017-01-16 NOTE — Telephone Encounter (Signed)
Spoke with patient, advised as seen below per Dr. Quincy Simmonds. Patient scheduled for nurse visit on 01/17/17 at 10am. Patient is agreeable to date and time.  Routing to provider for final review. Patient is agreeable to disposition. Will close encounter.

## 2017-01-17 ENCOUNTER — Ambulatory Visit (INDEPENDENT_AMBULATORY_CARE_PROVIDER_SITE_OTHER): Payer: Managed Care, Other (non HMO)

## 2017-01-17 VITALS — BP 124/82 | HR 72 | Resp 20 | Ht 68.0 in | Wt 254.8 lb

## 2017-01-17 DIAGNOSIS — Z01812 Encounter for preprocedural laboratory examination: Secondary | ICD-10-CM

## 2017-01-17 DIAGNOSIS — Z3042 Encounter for surveillance of injectable contraceptive: Secondary | ICD-10-CM | POA: Diagnosis not present

## 2017-01-17 LAB — POCT URINE PREGNANCY: Preg Test, Ur: NEGATIVE

## 2017-01-17 MED ORDER — MEDROXYPROGESTERONE ACETATE 150 MG/ML IM SUSP
150.0000 mg | Freq: Once | INTRAMUSCULAR | Status: AC
  Administered 2017-01-17: 150 mg via INTRAMUSCULAR

## 2017-01-17 NOTE — Progress Notes (Signed)
Patient is here for Depo Provera Injection Patient is within Depo Provera Calender Limits No, 10/14/16 5/7 - 5/21 Next Depo Due between: 8/10 - 8/24 Last AEX: 06/05/16 BS AEX Scheduled: 06/13/17 BS  Patient is aware when next depo is due  UPT: Negative  Pt tolerated Injection well. Injection given in [facility].   Routed to provider for review, encounter closed.

## 2017-03-28 ENCOUNTER — Other Ambulatory Visit: Payer: Self-pay | Admitting: Rheumatology

## 2017-03-28 NOTE — Telephone Encounter (Addendum)
Last Visit: 12/31/16 Next Visit: 06/03/17 Labs: 12/20/16 WBC 2.7 previously 3.2  Patient will update labs on 04/01/17.  Okay to refill 30 day supply MTX?

## 2017-03-30 NOTE — Telephone Encounter (Signed)
Reduce MTX to 5 tabs po qweek. Reduce Folic acid to 1 tab po qd. Repeat CBc in 1 month.

## 2017-03-31 NOTE — Telephone Encounter (Signed)
Attempted to contact the patient and left message for patient to call the office.  

## 2017-04-01 ENCOUNTER — Telehealth: Payer: Self-pay | Admitting: Rheumatology

## 2017-04-01 NOTE — Telephone Encounter (Signed)
Left a message 03/31/17 returning your call.

## 2017-04-01 NOTE — Telephone Encounter (Signed)
Patient advised to reduce MTX to 5 tabs per week and Folic Acid to 1 daily. Patient to come for labs in 1 month. Patient verbalized understanding.

## 2017-04-04 ENCOUNTER — Encounter: Payer: Self-pay | Admitting: *Deleted

## 2017-04-04 ENCOUNTER — Ambulatory Visit (INDEPENDENT_AMBULATORY_CARE_PROVIDER_SITE_OTHER): Payer: Managed Care, Other (non HMO) | Admitting: *Deleted

## 2017-04-04 VITALS — BP 126/72 | HR 68 | Ht 68.0 in | Wt 253.0 lb

## 2017-04-04 DIAGNOSIS — Z3042 Encounter for surveillance of injectable contraceptive: Secondary | ICD-10-CM

## 2017-04-04 MED ORDER — MEDROXYPROGESTERONE ACETATE 150 MG/ML IM SUSP
150.0000 mg | Freq: Once | INTRAMUSCULAR | Status: AC
  Administered 2017-04-04: 150 mg via INTRAMUSCULAR

## 2017-04-04 NOTE — Patient Instructions (Signed)
Please return between 06/20/17 and 07/04/17 for next Depo Provera injection.

## 2017-04-04 NOTE — Progress Notes (Signed)
Encounter reviewed by Dr. Brook Amundson C. Silva.  

## 2017-04-04 NOTE — Progress Notes (Signed)
Patient is here for Depo Provera Injection Patient is within Depo Provera Calender Limits 8/10-8/24 Next Depo Due between: 10/26-11/9 Last AEX: 06/05/16 BS AEX Scheduled: 06/13/17  Patient is aware when next depo is due  Pt tolerated Injection well in [place].  Routed to provider for review, encounter closed.

## 2017-04-22 ENCOUNTER — Other Ambulatory Visit: Payer: Self-pay | Admitting: Rheumatology

## 2017-04-22 NOTE — Telephone Encounter (Signed)
Last Visit: 12/31/16 Next Visit: 06/03/17 Labs: 12/20/16 WBC 2.7 previously 3.2

## 2017-04-23 NOTE — Telephone Encounter (Signed)
Patient reminded she is due for lab work. Patient will update this week.

## 2017-04-29 ENCOUNTER — Other Ambulatory Visit: Payer: Self-pay

## 2017-04-29 DIAGNOSIS — Z79899 Other long term (current) drug therapy: Secondary | ICD-10-CM

## 2017-04-29 NOTE — Telephone Encounter (Addendum)
Patient came to office today to get labs.   Okay to refill per Dr. Estanislado Pandy

## 2017-04-30 LAB — CBC WITH DIFFERENTIAL/PLATELET
Basophils Absolute: 37 cells/uL (ref 0–200)
Basophils Relative: 1 %
Eosinophils Absolute: 185 cells/uL (ref 15–500)
Eosinophils Relative: 5 %
HCT: 42.2 % (ref 35.0–45.0)
Hemoglobin: 13.9 g/dL (ref 11.7–15.5)
Lymphocytes Relative: 35 %
Lymphs Abs: 1295 cells/uL (ref 850–3900)
MCH: 31 pg (ref 27.0–33.0)
MCHC: 32.9 g/dL (ref 32.0–36.0)
MCV: 94.2 fL (ref 80.0–100.0)
MPV: 10.2 fL (ref 7.5–12.5)
Monocytes Absolute: 259 cells/uL (ref 200–950)
Monocytes Relative: 7 %
Neutro Abs: 1924 cells/uL (ref 1500–7800)
Neutrophils Relative %: 52 %
Platelets: 227 10*3/uL (ref 140–400)
RBC: 4.48 MIL/uL (ref 3.80–5.10)
RDW: 13.8 % (ref 11.0–15.0)
WBC: 3.7 10*3/uL — ABNORMAL LOW (ref 3.8–10.8)

## 2017-04-30 LAB — COMPLETE METABOLIC PANEL WITH GFR
ALT: 12 U/L (ref 6–29)
AST: 10 U/L (ref 10–30)
Albumin: 4.5 g/dL (ref 3.6–5.1)
Alkaline Phosphatase: 56 U/L (ref 33–115)
BUN: 15 mg/dL (ref 7–25)
CO2: 23 mmol/L (ref 20–32)
Calcium: 9.3 mg/dL (ref 8.6–10.2)
Chloride: 104 mmol/L (ref 98–110)
Creat: 0.79 mg/dL (ref 0.50–1.10)
GFR, Est African American: >89 mL/min (ref >=60)
GFR, Est Non African American: >89 mL/min (ref >=60)
Glucose, Bld: 98 mg/dL (ref 65–99)
Potassium: 4.6 mmol/L (ref 3.5–5.3)
Sodium: 138 mmol/L (ref 135–146)
Total Bilirubin: 0.3 mg/dL (ref 0.2–1.2)
Total Protein: 7.5 g/dL (ref 6.1–8.1)

## 2017-04-30 NOTE — Progress Notes (Signed)
stable °

## 2017-05-27 NOTE — Progress Notes (Deleted)
Office Visit Note  Patient: Joyce Zhang             Date of Birth: [birth year]           MRN: [id number]             PCP: Bartholome Bill, MD Referring: Bartholome Bill, MD Visit Date: 06/03/2017 Occupation: @GUAROCC @    Subjective:  No chief complaint on file.   History of Present Illness: Joyce Zhang is a 46 y.o. female ***   Activities of Daily Living:  Patient reports morning stiffness for *** {minute/hour:19697}.   Patient {ACTIONS;DENIES/REPORTS:21021675::"Denies"} nocturnal pain.  Difficulty dressing/grooming: {ACTIONS;DENIES/REPORTS:21021675::"Denies"} Difficulty climbing stairs: {ACTIONS;DENIES/REPORTS:21021675::"Denies"} Difficulty getting out of chair: {ACTIONS;DENIES/REPORTS:21021675::"Denies"} Difficulty using hands for taps, buttons, cutlery, and/or writing: {ACTIONS;DENIES/REPORTS:21021675::"Denies"}   No Rheumatology ROS completed.   PMFS History:  Patient Active Problem List   Diagnosis Date Noted  . Leukocytoclastic vasculitis ([place]) 09/16/2016  . Alcohol use 09/13/2016  . Vasculitis on skin biopsy ([place]) 07/02/2016  . Episcleritis of both eyes 07/02/2016  . High risk medication use 07/02/2016  . Asthma 07/02/2016  . Allergic rhinitis 07/02/2016  . Noncompliance 07/02/2016  . History of DVT (deep vein thrombosis) 07/02/2016  . OP (osteoporosis) 07/02/2016  . Smoker 07/02/2016    Past Medical History:  Diagnosis Date  . Dysmenorrhea   . Episcleritis of both eyes   . Fibroid   . H/O vasculitis    in legs  . Scleritis     Family History  Problem Relation Age of Onset  . Adopted: Yes  . Diabetes Mother   . Asthma Mother   . Breast cancer Mother   . Diabetes Maternal Grandmother   . Asthma Maternal Grandmother   . Breast cancer Maternal Grandmother   . Diabetes Maternal Grandfather    Past Surgical History:  Procedure Laterality Date  . broken foot Right   . fracture of index finger Left    Social History   Social History  Narrative  . No narrative on file     Objective: Vital Signs: There were no vitals taken for this visit.   Physical Exam   Musculoskeletal Exam: ***  CDAI Exam: No CDAI exam completed.    Investigation: No additional findings. CBC Latest Ref Rng & Units 04/29/2017 12/20/2016 07/04/2016  WBC 3.8 - 10.8 K/uL 3.7(L) 2.7(L) 3.2(L)  Hemoglobin 11.7 - 15.5 g/dL 13.9 13.3 13.8  Hematocrit 35.0 - 45.0 % 42.2 39.8 41.6  Platelets 140 - 400 K/uL 227 201 222   CMP Latest Ref Rng & Units 04/29/2017 12/20/2016 07/04/2016  Glucose 65 - 99 mg/dL 98 97 101(H)  BUN 7 - 25 mg/dL 15 14 10   Creatinine 0.50 - 1.10 mg/dL 0.79 0.85 0.81  Sodium 135 - 146 mmol/L 138 138 138  Potassium 3.5 - 5.3 mmol/L 4.6 4.5 4.3  Chloride 98 - 110 mmol/L 104 104 105  CO2 20 - 32 mmol/L 23 22 22   Calcium 8.6 - 10.2 mg/dL 9.3 9.0 9.4  Total Protein 6.1 - 8.1 g/dL 7.5 7.0 7.9  Total Bilirubin 0.2 - 1.2 mg/dL 0.3 0.4 0.4  Alkaline Phos 33 - 115 U/L 56 47 61  AST 10 - 30 U/L 10 13 19   ALT 6 - 29 U/L 12 14 19     Imaging: No results found.  Speciality Comments: No specialty comments available.    Procedures:  No procedures performed Allergies: Patient has no known allergies.   Assessment / Plan:     Visit Diagnoses:  Episcleritis of both eyes  Leukocytoclastic vasculitis ([place])  Vasculitis on skin biopsy ([facility])  High risk medication use - Methotrexate   Noncompliance  History of DVT (deep vein thrombosis)  History of asthma  Smoker  History of alcohol use    Orders: No orders of the defined types were placed in this encounter.  No orders of the defined types were placed in this encounter.   Face-to-face time spent with patient was *** minutes. 50% of time was spent in counseling and coordination of care.  Follow-Up Instructions: No Follow-up on file.   Amy Littrell, RT  Note - This record has been created using [employer].  Chart creation errors have been sought, but may not always    have been located. Such creation errors do not reflect on  the standard of medical care.

## 2017-06-02 NOTE — Progress Notes (Deleted)
Office Visit Note  Patient: Joyce Zhang             Date of Birth: 01-29-1971           MRN: [id number]             PCP: Bartholome Bill, MD Referring: Bartholome Bill, MD Visit Date: 06/12/2017 Occupation: @GUAROCC @    Subjective:  No chief complaint on file.   History of Present Illness: Joyce Zhang is a 46 y.o. female ***   Activities of Daily Living:  Patient reports morning stiffness for *** {minute/hour:19697}.   Patient {ACTIONS;DENIES/REPORTS:21021675::"Denies"} nocturnal pain.  Difficulty dressing/grooming: {ACTIONS;DENIES/REPORTS:21021675::"Denies"} Difficulty climbing stairs: {ACTIONS;DENIES/REPORTS:21021675::"Denies"} Difficulty getting out of chair: {ACTIONS;DENIES/REPORTS:21021675::"Denies"} Difficulty using hands for taps, buttons, cutlery, and/or writing: {ACTIONS;DENIES/REPORTS:21021675::"Denies"}   No Rheumatology ROS completed.   PMFS History:  Patient Active Problem List   Diagnosis Date Noted  . Leukocytoclastic vasculitis ([place]) 09/16/2016  . Alcohol use 09/13/2016  . Vasculitis on skin biopsy ([place]) 07/02/2016  . Episcleritis of both eyes 07/02/2016  . High risk medication use 07/02/2016  . Asthma 07/02/2016  . Allergic rhinitis 07/02/2016  . Noncompliance 07/02/2016  . History of DVT (deep vein thrombosis) 07/02/2016  . OP (osteoporosis) 07/02/2016  . Smoker 07/02/2016    Past Medical History:  Diagnosis Date  . Dysmenorrhea   . Episcleritis of both eyes   . Fibroid   . H/O vasculitis    in legs  . Scleritis     Family History  Problem Relation Age of Onset  . Adopted: Yes  . Diabetes Mother   . Asthma Mother   . Breast cancer Mother   . Diabetes Maternal Grandmother   . Asthma Maternal Grandmother   . Breast cancer Maternal Grandmother   . Diabetes Maternal Grandfather    Past Surgical History:  Procedure Laterality Date  . broken foot Right   . fracture of index finger Left    Social History   Social History  Narrative  . No narrative on file     Objective: Vital Signs: There were no vitals taken for this visit.   Physical Exam   Musculoskeletal Exam: ***  CDAI Exam: No CDAI exam completed.    Investigation: No additional findings.TB Gold: Negative in 06/2016 CBC Latest Ref Rng & Units 04/29/2017 12/20/2016 07/04/2016  WBC 3.8 - 10.8 K/uL 3.7(L) 2.7(L) 3.2(L)  Hemoglobin 11.7 - 15.5 g/dL 13.9 13.3 13.8  Hematocrit 35.0 - 45.0 % 42.2 39.8 41.6  Platelets 140 - 400 K/uL 227 201 222   CMP Latest Ref Rng & Units 04/29/2017 12/20/2016 07/04/2016  Glucose 65 - 99 mg/dL 98 97 101(H)  BUN 7 - 25 mg/dL 15 14 10   Creatinine 0.50 - 1.10 mg/dL 0.79 0.85 0.81  Sodium 135 - 146 mmol/L 138 138 138  Potassium 3.5 - 5.3 mmol/L 4.6 4.5 4.3  Chloride 98 - 110 mmol/L 104 104 105  CO2 20 - 32 mmol/L 23 22 22   Calcium 8.6 - 10.2 mg/dL 9.3 9.0 9.4  Total Protein 6.1 - 8.1 g/dL 7.5 7.0 7.9  Total Bilirubin 0.2 - 1.2 mg/dL 0.3 0.4 0.4  Alkaline Phos 33 - 115 U/L 56 47 61  AST 10 - 30 U/L 10 13 19   ALT 6 - 29 U/L 12 14 19     Imaging: No results found.  Speciality Comments: No specialty comments available.    Procedures:  No procedures performed Allergies: Patient has no known allergies.   Assessment / Plan:  Visit Diagnoses: No diagnosis found.    Orders: No orders of the defined types were placed in this encounter.  No orders of the defined types were placed in this encounter.   Face-to-face time spent with patient was *** minutes. 50% of time was spent in counseling and coordination of care.  Follow-Up Instructions: No Follow-up on file.   Earnestine Mealing, NT  Note - This record has been created using [employer].  Chart creation errors have been sought, but may not always  have been located. Such creation errors do not reflect on  the standard of medical care.

## 2017-06-03 ENCOUNTER — Ambulatory Visit: Payer: Managed Care, Other (non HMO) | Admitting: Rheumatology

## 2017-06-12 ENCOUNTER — Ambulatory Visit: Payer: Managed Care, Other (non HMO) | Admitting: Rheumatology

## 2017-06-13 ENCOUNTER — Ambulatory Visit (INDEPENDENT_AMBULATORY_CARE_PROVIDER_SITE_OTHER): Payer: Managed Care, Other (non HMO) | Admitting: Obstetrics and Gynecology

## 2017-06-13 ENCOUNTER — Encounter: Payer: Self-pay | Admitting: Obstetrics and Gynecology

## 2017-06-13 VITALS — BP 142/82 | HR 64 | Resp 14 | Ht 66.5 in | Wt 254.6 lb

## 2017-06-13 DIAGNOSIS — Z1211 Encounter for screening for malignant neoplasm of colon: Secondary | ICD-10-CM

## 2017-06-13 DIAGNOSIS — Z3042 Encounter for surveillance of injectable contraceptive: Secondary | ICD-10-CM | POA: Diagnosis not present

## 2017-06-13 DIAGNOSIS — Z01419 Encounter for gynecological examination (general) (routine) without abnormal findings: Secondary | ICD-10-CM

## 2017-06-13 MED ORDER — MEDROXYPROGESTERONE ACETATE 150 MG/ML IM SUSP
150.0000 mg | Freq: Once | INTRAMUSCULAR | Status: AC
  Administered 2017-06-13: 150 mg via INTRAMUSCULAR

## 2017-06-13 NOTE — Patient Instructions (Signed)

## 2017-06-13 NOTE — Addendum Note (Signed)
Addended by: Yisroel Ramming, BROOK E on: 06/13/2017 02:13 PM   Modules accepted: Orders

## 2017-06-13 NOTE — Progress Notes (Signed)
46 y.o. G0P0000 Single African American female here for annual exam.    Would like to get Depo Provera injection today if possible. No bleeding.   PCP: Precious Haws, MD   Rheumatology:  Dr. Corena Pilgrim.  No LMP recorded. Patient has had an injection.     Period Cycle (Days):  (no cycles due to Depo Provera)     Sexually active: Yes.   female partner.  Not in relationship. The current method of family planning is Depo-Provera injections.    Exercising: No.   Smoker:  Yes, smokes 5-7 cigarettes/day  Health Maintenance: Pap: 05-17-15 Neg:Neg HR HPV, 01-27-14 Neg:Neg HR HPV History of abnormal Pap:  no MMG: 08-09-16 Density B/Neg/BiRads1:TBC Colonoscopy:  n/a BMD:   n/a  Result  n/a TDaP:  2012 Gardasil:   no HIV: 05-17-15 NR Hep C: 05-17-15 Neg Screening Labs:  Hb today: Rheumatologist, Urine today: not done   reports that she has been smoking Cigarettes.  She has been smoking about 0.25 packs per day. She has never used smokeless tobacco. She reports that she drinks about 1.8 oz of alcohol per week . She reports that she does not use drugs.  Past Medical History:  Diagnosis Date  . Bone spur of left foot   . Dysmenorrhea   . Episcleritis of both eyes   . Fibroid   . H/O vasculitis    in legs  . Scleritis     Past Surgical History:  Procedure Laterality Date  . broken foot Right   . fracture of index finger Left     Current Outpatient Prescriptions  Medication Sig Dispense Refill  . fexofenadine (ALLEGRA) 180 MG tablet Take 180 mg by mouth daily.    . folic acid (FOLVITE) 1 MG tablet Take 2 tablets (2 mg total) by mouth daily. 180 tablet 3  . medroxyPROGESTERone (DEPO-PROVERA) 150 MG/ML injection   3  . methotrexate (RHEUMATREX) 2.5 MG tablet TAKE 5 TABLETS(12.5 MG) BY MOUTH 1 TIME A WEEK 60 tablet 0   No current facility-administered medications for this visit.     Family History  Problem Relation Age of Onset  . Adopted: Yes  . Diabetes Mother   . Asthma Mother   .  Breast cancer Mother   . Diabetes Maternal Grandmother   . Asthma Maternal Grandmother   . Breast cancer Maternal Grandmother   . Diabetes Maternal Grandfather     ROS:  Pertinent items are noted in HPI.  Otherwise, a comprehensive ROS was negative.  Exam:   BP (!) 142/82 (BP Location: Right Arm, Patient Position: Sitting, Cuff Size: Large)   Pulse 64   Resp 14   Ht 5' 6.5" (1.689 m)   Wt 254 lb 9.6 oz (115.5 kg)   BMI 40.48 kg/m     General appearance: alert, cooperative and appears stated age Head: Normocephalic, without obvious abnormality, atraumatic Neck: no adenopathy, supple, symmetrical, trachea midline and thyroid normal to inspection and palpation Lungs: clear to auscultation bilaterally Breasts: normal appearance, no masses or tenderness, No nipple retraction or dimpling, No nipple discharge or bleeding, No axillary or supraclavicular adenopathy Heart: regular rate and rhythm Abdomen: soft, non-tender; no masses, no organomegaly Extremities: extremities normal, atraumatic, no cyanosis or edema Skin: Skin color, texture, turgor normal. No rashes or lesions Lymph nodes: Cervical, supraclavicular, and axillary nodes normal. No abnormal inguinal nodes palpated Neurologic: Grossly normal  Pelvic: External genitalia:  no lesions  Urethra:  normal appearing urethra with no masses, tenderness or lesions              Bartholins and Skenes: normal                 Vagina: normal appearing vagina with normal color and discharge, no lesions              Cervix: no lesions              Pap taken: No. Bimanual Exam:  Uterus:  normal size, contour, position, consistency, mobility, non-tender              Adnexa: no mass, fullness, tenderness              Rectal exam: Yes.  .  Confirms.              Anus:  normal sphincter tone, no lesions  Chaperone was present for exam.  Assessment:   Well woman visit with normal exam. Uterine fibroids.  FH breast cancer. On  Depo Provera. Smoker.   Plan: Mammogram screening discussed. Recommended self breast awareness. Pap and HR HPV as above. Guidelines for Calcium, Vitamin D, regular exercise program including cardiovascular and weight bearing exercise. Lipid profile. Due for colonoscopy.  She will consider this.  Will do IFOB kit.  Depo Provera 150 mg Im q 3 months x 1 year.  Discussed smoking cessation.  She states she is not ready to quit yet. Follow up annually and prn.   After visit summary provided.

## 2017-06-14 LAB — LIPID PANEL
Chol/HDL Ratio: 2.5 ratio (ref 0.0–4.4)
Cholesterol, Total: 153 mg/dL (ref 100–199)
HDL: 61 mg/dL (ref >39)
LDL Calculated: 81 mg/dL (ref 0–99)
Triglycerides: 55 mg/dL (ref 0–149)
VLDL Cholesterol Cal: 11 mg/dL (ref 5–40)

## 2017-06-18 ENCOUNTER — Telehealth: Payer: Self-pay | Admitting: Obstetrics and Gynecology

## 2017-06-18 NOTE — Telephone Encounter (Signed)
Spoke with patient and reminded her to complete IFOB and return to office. She states she will do soon.

## 2017-06-18 NOTE — Telephone Encounter (Signed)
Please reach out to patient by phone and remind her to return to IFOB.

## 2017-06-20 ENCOUNTER — Ambulatory Visit: Payer: Managed Care, Other (non HMO)

## 2017-06-25 ENCOUNTER — Other Ambulatory Visit: Payer: Self-pay | Admitting: Obstetrics and Gynecology

## 2017-06-25 DIAGNOSIS — Z1231 Encounter for screening mammogram for malignant neoplasm of breast: Secondary | ICD-10-CM

## 2017-07-22 ENCOUNTER — Other Ambulatory Visit: Payer: Self-pay | Admitting: Rheumatology

## 2017-07-22 NOTE — Telephone Encounter (Signed)
Last Visit: 12/31/16 Next Visit: 08/21/17 Labs: 04/29/17 Stable  Okay to refill per Dr. Estanislado Pandy

## 2017-08-07 ENCOUNTER — Telehealth: Payer: Self-pay | Admitting: Obstetrics and Gynecology

## 2017-08-07 NOTE — Progress Notes (Deleted)
Office Visit Note  Patient: Joyce Zhang             Date of Birth: 10/20/1970           MRN: [id number]             PCP: Bartholome Bill, MD Referring: Bartholome Bill, MD Visit Date: 08/21/2017 Occupation: @GUAROCC @    Subjective:  No chief complaint on file.   History of Present Illness: Joyce Zhang is a 46 y.o. female ***   Activities of Daily Living:  Patient reports morning stiffness for *** {minute/hour:19697}.   Patient {ACTIONS;DENIES/REPORTS:21021675::"Denies"} nocturnal pain.  Difficulty dressing/grooming: {ACTIONS;DENIES/REPORTS:21021675::"Denies"} Difficulty climbing stairs: {ACTIONS;DENIES/REPORTS:21021675::"Denies"} Difficulty getting out of chair: {ACTIONS;DENIES/REPORTS:21021675::"Denies"} Difficulty using hands for taps, buttons, cutlery, and/or writing: {ACTIONS;DENIES/REPORTS:21021675::"Denies"}   No Rheumatology ROS completed.   PMFS History:  Patient Active Problem List   Diagnosis Date Noted  . Leukocytoclastic vasculitis ([place]) 09/16/2016  . Alcohol use 09/13/2016  . Vasculitis on skin biopsy ([place]) 07/02/2016  . Episcleritis of both eyes 07/02/2016  . High risk medication use 07/02/2016  . Asthma 07/02/2016  . Allergic rhinitis 07/02/2016  . Noncompliance 07/02/2016  . History of DVT (deep vein thrombosis) 07/02/2016  . OP (osteoporosis) 07/02/2016  . Smoker 07/02/2016    Past Medical History:  Diagnosis Date  . Bone spur of left foot   . Dysmenorrhea   . Episcleritis of both eyes   . Fibroid   . H/O vasculitis    in legs  . Scleritis     Family History  Adopted: Yes  Problem Relation Age of Onset  . Diabetes Mother   . Asthma Mother   . Breast cancer Mother   . Diabetes Maternal Grandmother   . Asthma Maternal Grandmother   . Breast cancer Maternal Grandmother   . Diabetes Maternal Grandfather    Past Surgical History:  Procedure Laterality Date  . broken foot Right   . fracture of index finger Left    Social  History   Social History Narrative  . Not on file     Objective: Vital Signs: There were no vitals taken for this visit.   Physical Exam   Musculoskeletal Exam: ***  CDAI Exam: No CDAI exam completed.    Investigation: No additional findings. CBC Latest Ref Rng & Units 04/29/2017 12/20/2016 07/04/2016  WBC 3.8 - 10.8 K/uL 3.7(L) 2.7(L) 3.2(L)  Hemoglobin 11.7 - 15.5 g/dL 13.9 13.3 13.8  Hematocrit 35.0 - 45.0 % 42.2 39.8 41.6  Platelets 140 - 400 K/uL 227 201 222   CMP Latest Ref Rng & Units 04/29/2017 12/20/2016 07/04/2016  Glucose 65 - 99 mg/dL 98 97 101(H)  BUN 7 - 25 mg/dL 15 14 10   Creatinine 0.50 - 1.10 mg/dL 0.79 0.85 0.81  Sodium 135 - 146 mmol/L 138 138 138  Potassium 3.5 - 5.3 mmol/L 4.6 4.5 4.3  Chloride 98 - 110 mmol/L 104 104 105  CO2 20 - 32 mmol/L 23 22 22   Calcium 8.6 - 10.2 mg/dL 9.3 9.0 9.4  Total Protein 6.1 - 8.1 g/dL 7.5 7.0 7.9  Total Bilirubin 0.2 - 1.2 mg/dL 0.3 0.4 0.4  Alkaline Phos 33 - 115 U/L 56 47 61  AST 10 - 30 U/L 10 13 19   ALT 6 - 29 U/L 12 14 19     Imaging: No results found.  Speciality Comments: No specialty comments available.    Procedures:  No procedures performed Allergies: Patient has no known allergies.   Assessment / Plan:  Visit Diagnoses: No diagnosis found.    Orders: No orders of the defined types were placed in this encounter.  No orders of the defined types were placed in this encounter.   Face-to-face time spent with patient was *** minutes. 50% of time was spent in counseling and coordination of care.  Follow-Up Instructions: No Follow-up on file.   Earnestine Mealing, CMA  Note - This record has been created using [employer], [employer].  Chart creation errors have been sought, but may not always  have been located. Such creation errors do not reflect on  the standard of medical care.

## 2017-08-07 NOTE — Telephone Encounter (Signed)
Patient cancelled depo provera and aex appointments because [place] has [place] of [place].

## 2017-08-07 NOTE — Telephone Encounter (Signed)
Routing to [employer] CNM. Encounter closed.

## 2017-08-11 ENCOUNTER — Ambulatory Visit: Payer: 59

## 2017-08-21 ENCOUNTER — Ambulatory Visit: Payer: Managed Care, Other (non HMO) | Admitting: Rheumatology

## 2017-08-29 ENCOUNTER — Ambulatory Visit: Payer: Managed Care, Other (non HMO)

## 2018-06-24 ENCOUNTER — Ambulatory Visit: Payer: Managed Care, Other (non HMO) | Admitting: Obstetrics and Gynecology

## 2019-12-07 ENCOUNTER — Other Ambulatory Visit: Payer: Self-pay | Admitting: Obstetrics and Gynecology

## 2019-12-07 DIAGNOSIS — Z1231 Encounter for screening mammogram for malignant neoplasm of breast: Secondary | ICD-10-CM

## 2019-12-17 ENCOUNTER — Other Ambulatory Visit: Payer: Self-pay

## 2019-12-20 ENCOUNTER — Other Ambulatory Visit: Payer: Self-pay

## 2019-12-20 ENCOUNTER — Ambulatory Visit
Admission: RE | Admit: 2019-12-20 | Discharge: 2019-12-20 | Disposition: A | Discharge status: Home / Self Care | Payer: 59 | Source: Ambulatory Visit | Attending: Obstetrics and Gynecology | Admitting: Obstetrics and Gynecology

## 2019-12-20 ENCOUNTER — Encounter: Payer: Self-pay | Admitting: Obstetrics and Gynecology

## 2019-12-20 ENCOUNTER — Ambulatory Visit: Payer: 59 | Admitting: Obstetrics and Gynecology

## 2019-12-20 ENCOUNTER — Other Ambulatory Visit (HOSPITAL_COMMUNITY)
Admission: RE | Admit: 2019-12-20 | Discharge: 2019-12-20 | Disposition: A | Discharge status: Home / Self Care | Payer: 59 | Source: Ambulatory Visit | Attending: Obstetrics and Gynecology | Admitting: Obstetrics and Gynecology

## 2019-12-20 VITALS — BP 148/80 | HR 66 | Temp 97.0°F | Resp 14 | Ht 67.0 in | Wt 282.0 lb

## 2019-12-20 DIAGNOSIS — N951 Menopausal and female climacteric states: Secondary | ICD-10-CM | POA: Diagnosis not present

## 2019-12-20 DIAGNOSIS — Z01419 Encounter for gynecological examination (general) (routine) without abnormal findings: Secondary | ICD-10-CM

## 2019-12-20 DIAGNOSIS — N912 Amenorrhea, unspecified: Secondary | ICD-10-CM | POA: Diagnosis not present

## 2019-12-20 DIAGNOSIS — Z113 Encounter for screening for infections with a predominantly sexual mode of transmission: Secondary | ICD-10-CM | POA: Diagnosis present

## 2019-12-20 DIAGNOSIS — Z1211 Encounter for screening for malignant neoplasm of colon: Secondary | ICD-10-CM | POA: Diagnosis not present

## 2019-12-20 DIAGNOSIS — Z1231 Encounter for screening mammogram for malignant neoplasm of breast: Secondary | ICD-10-CM

## 2019-12-20 DIAGNOSIS — R7989 Other specified abnormal findings of blood chemistry: Secondary | ICD-10-CM

## 2019-12-20 MED ORDER — NYSTATIN 100000 UNIT/GM EX POWD
1.0000 | Freq: Three times a day (TID) | CUTANEOUS | 3 refills | Status: DC
Start: 2019-12-20 — End: 2020-09-04

## 2019-12-20 NOTE — Progress Notes (Signed)
49 y.o. G0P0000 Single African American female here for annual exam.    Patient just moved back to [place] from [place].  She would like full STD testing.  Patient complaining of irritation in both groin areas--OTC medication not helping.  Menses are sporadic.  2 cycles this year.  Last year had 2 - 3 cycles.  Not painful.  Declines Depo provera.  Last injection was in 2018.   Not sure if she is having hot flashes.  PCP:  Precious Haws, MD   Patient's last menstrual period was 11/25/2019 (exact date).     Period Duration (Days): 2-3 days Period Pattern: (!) Irregular Menstrual Flow: Light Menstrual Control: Tampon Menstrual Control Change Freq (Hours): very light cycles Dysmenorrhea: (!) Mild     Sexually active: Yes.    The current method of family planning is none.  Same sex partner.  Together for 10 years.  Exercising: No.  The patient does not participate in regular exercise at present. Smoker:  No--quit 1 year ago  Health Maintenance: Pap: 05-17-15 Neg:Neg HR HPV, 01-27-14 Neg:Neg HR HPV  History of abnormal Pap:  no MMG:  08-09-16 Neg/density B/BiRads1--Appt.today Colonoscopy:  n/a BMD:   n/a  Result  n/a TDaP:  2012 Gardasil:   no HIV:05-17-15 NR Hep C:05-17-15 Neg Screening Labs:  today.   reports that she quit smoking about 13 months ago. Her smoking use included cigarettes. She smoked 0.25 packs per day. She has never used smokeless tobacco. She reports current alcohol use of about 3.0 standard drinks of alcohol per week. She reports that she does not use drugs.  Past Medical History:  Diagnosis Date  . Bone spur of left foot   . Dysmenorrhea   . Episcleritis of both eyes   . Fibroid   . H/O vasculitis    in legs  . Scleritis     Past Surgical History:  Procedure Laterality Date  . broken foot Right   . fracture of index finger Left     Current Outpatient Medications  Medication Sig Dispense Refill  . fexofenadine (ALLEGRA) 180 MG tablet Take 180 mg by mouth  daily.    Marland Kitchen nystatin (MYCOSTATIN/NYSTOP) powder Apply 1 application topically 3 (three) times daily. Apply to affected area for up to 7 days 30 g 3   No current facility-administered medications for this visit.    Family History  Adopted: Yes  Problem Relation Age of Onset  . Diabetes Mother   . Asthma Mother   . Breast cancer Mother   . Diabetes Maternal Grandmother   . Asthma Maternal Grandmother   . Breast cancer Maternal Grandmother   . Diabetes Maternal Grandfather     Review of Systems  Skin:       Irritation in both groin areas  All other systems reviewed and are negative.   Exam:   BP (!) 148/80 (Cuff Size: Large)   Pulse 66   Temp (!) 97 F (36.1 C) (Temporal)   Resp 14   Ht 5\' 7"  (1.702 m)   Wt 282 lb (127.9 kg)   LMP 11/25/2019 (Exact Date)   BMI 44.17 kg/m     General appearance: alert, cooperative and appears stated age Head: normocephalic, without obvious abnormality, atraumatic Neck: no adenopathy, supple, symmetrical, trachea midline and thyroid normal to inspection and palpation Lungs: clear to auscultation bilaterally Breasts: normal appearance, no masses or tenderness, No nipple retraction or dimpling, No nipple discharge or bleeding, No axillary adenopathy Heart: regular rate and rhythm  Abdomen: soft, non-tender; no masses, no organomegaly Extremities: extremities normal, atraumatic, no cyanosis or edema Skin: skin color, texture, turgor normal. Greyish color of inguinal skin.  Lymph nodes: cervical, supraclavicular, and axillary nodes normal. Neurologic: grossly normal  Pelvic: External genitalia:  no lesions              No abnormal inguinal nodes palpated.              Urethra:  normal appearing urethra with no masses, tenderness or lesions              Bartholins and Skenes: normal                 Vagina: normal appearing vagina with normal color and discharge, no lesions              Cervix: no lesions              Pap taken: Yes.    Bimanual Exam:  Uterus:  normal size, contour, position, consistency, mobility, non-tender              Adnexa: no mass, fullness, tenderness              Rectal exam: Yes.  .  Confirms.              Anus:  normal sphincter tone, no lesions  Chaperone was present for exam.  Assessment:   Well woman visit with normal exam. Uterine fibroids.  FH breast cancer. Amenorrhea. Menopausal symptoms.  Probably yeast of flexural folds. Hx vasculitis.   Plan: Mammogram screening discussed.  Has appointment today.  Self breast awareness reviewed. Pap and HR HPV as above. Guidelines for Calcium, Vitamin D, regular exercise program including cardiovascular and weight bearing exercise. IFOB.  Routine labs, FSH, estradiol, prolactin, TSH, STD screening.  We discussed potential course of Provera every 2 months if not menopausal. Nystatin powder.  She will re-establish care with her PCP. Follow up annually and prn.   After visit summary provided.

## 2019-12-20 NOTE — Patient Instructions (Signed)

## 2019-12-21 LAB — CYTOLOGY - PAP
Chlamydia: NEGATIVE
Comment: NEGATIVE
Comment: NEGATIVE
Comment: NEGATIVE
Comment: NORMAL
Diagnosis: NEGATIVE
High risk HPV: NEGATIVE
Neisseria Gonorrhea: NEGATIVE
Trichomonas: NEGATIVE

## 2019-12-21 LAB — LIPID PANEL
Chol/HDL Ratio: 2.4 ratio (ref 0.0–4.4)
Cholesterol, Total: 159 mg/dL (ref 100–199)
HDL: 67 mg/dL (ref >39)
LDL Chol Calc (NIH): 72 mg/dL (ref 0–99)
Triglycerides: 115 mg/dL (ref 0–149)
VLDL Cholesterol Cal: 20 mg/dL (ref 5–40)

## 2019-12-21 LAB — CBC
Hematocrit: 35.8 % (ref 34.0–46.6)
Hemoglobin: 11.9 g/dL (ref 11.1–15.9)
MCH: 29.2 pg (ref 26.6–33.0)
MCHC: 33.2 g/dL (ref 31.5–35.7)
MCV: 88 fL (ref 79–97)
Platelets: 196 10*3/uL (ref 150–450)
RBC: 4.08 x10E6/uL (ref 3.77–5.28)
RDW: 13.6 % (ref 11.7–15.4)
WBC: 3.4 10*3/uL (ref 3.4–10.8)

## 2019-12-21 LAB — COMPREHENSIVE METABOLIC PANEL
ALT: 10 IU/L (ref 0–32)
AST: 10 IU/L (ref 0–40)
Albumin/Globulin Ratio: 1.3 (ref 1.2–2.2)
Albumin: 4.2 g/dL (ref 3.8–4.8)
Alkaline Phosphatase: 58 IU/L (ref 39–117)
BUN/Creatinine Ratio: 12 (ref 9–23)
BUN: 13 mg/dL (ref 6–24)
Bilirubin Total: <0.2 mg/dL (ref 0.0–1.2)
CO2: 23 mmol/L (ref 20–29)
Calcium: 9.2 mg/dL (ref 8.7–10.2)
Chloride: 100 mmol/L (ref 96–106)
Creatinine, Ser: 1.08 mg/dL — ABNORMAL HIGH (ref 0.57–1.00)
GFR calc Af Amer: 71 mL/min/{1.73_m2} (ref >59)
GFR calc non Af Amer: 61 mL/min/{1.73_m2} (ref >59)
Globulin, Total: 3.3 g/dL (ref 1.5–4.5)
Glucose: 98 mg/dL (ref 65–99)
Potassium: 4.2 mmol/L (ref 3.5–5.2)
Sodium: 138 mmol/L (ref 134–144)
Total Protein: 7.5 g/dL (ref 6.0–8.5)

## 2019-12-21 LAB — HEP, RPR, HIV PANEL
HIV Screen 4th Generation wRfx: NONREACTIVE
Hepatitis B Surface Ag: NEGATIVE
RPR Ser Ql: NONREACTIVE

## 2019-12-21 LAB — TSH: TSH: 0.916 u[IU]/mL (ref 0.450–4.500)

## 2019-12-21 LAB — HEPATITIS C ANTIBODY: Hep C Virus Ab: <0.1 s/co ratio (ref 0.0–0.9)

## 2019-12-21 LAB — PROLACTIN: Prolactin: 10.9 ng/mL (ref 4.8–23.3)

## 2019-12-21 LAB — FOLLICLE STIMULATING HORMONE: FSH: 11.3 m[IU]/mL

## 2019-12-21 LAB — ESTRADIOL: Estradiol: 37.5 pg/mL

## 2019-12-24 ENCOUNTER — Telehealth: Payer: Self-pay | Admitting: *Deleted

## 2019-12-24 NOTE — Telephone Encounter (Signed)
Call to patient. All results reviewed with patient and she verbalized understanding. Patient states she would like to restart the depo series. LMP ~ beginning of April. Patient states cycles are so sporadic, unsure of when she will have another cycle. Female partner. Patient states she would like to come in for depo and to have labs on same day if possible. Nurse visit for depo scheduled for 01-03-20 at 1515. Patient agreeable to date and time of appointment. Advised patient could have labs drawn at appointment. Future order present for BMP.   Routing to provider and will close encounter.

## 2019-12-24 NOTE — Addendum Note (Signed)
Addended by: Yisroel Ramming, Dietrich Pates E on: 12/24/2019 10:53 AM   Modules accepted: Orders

## 2019-12-24 NOTE — Telephone Encounter (Signed)
-----   Message from Nunzio Cobbs, MD sent at 12/24/2019 10:53 AM EDT ----- Please contact patient in follow up to her test results.   Her creatinine is slightly elevated, and I recommend this be retested again in the office in 2 weeks to see if there is a change occurring with her kidney function.  Please schedule this lab visit.  Her hormonal testing indicates she is not menopausal.  Her thyroid and prolactin are normal. She is skipping her menstrual cycles, and this may be due to her weight change.  I recommend taking progesterone to protect her endometrium, the uterine lining, from developing endometrial hyperplasia.  She has some options for this: 1 - Provera 10 mg x 10 days per month and do this at least every other month. Rx for one year.  2 - Micronor progesterone only birth control pill (taken every day with no placebo pills).  Rx for one year. 3 - Return to Depo Provera 150 mg Im q 3 months.  Rx for one year.  Her testing for infectious disease is all negative for HIV, syphilis, hepatitis B and C, trichomonas, gonorrhea, and chlamydia.   Her pap is normal, and her high risk HPV is negative.  Recall - 02.   Her cholesterol and blood counts are normal.

## 2019-12-31 ENCOUNTER — Other Ambulatory Visit: Payer: Self-pay

## 2020-01-03 ENCOUNTER — Telehealth: Payer: Self-pay | Admitting: *Deleted

## 2020-01-03 ENCOUNTER — Ambulatory Visit (INDEPENDENT_AMBULATORY_CARE_PROVIDER_SITE_OTHER): Payer: 59 | Admitting: *Deleted

## 2020-01-03 ENCOUNTER — Other Ambulatory Visit: Payer: Self-pay

## 2020-01-03 VITALS — BP 132/88 | HR 74 | Temp 97.3°F | Resp 14 | Ht 67.0 in | Wt 282.2 lb

## 2020-01-03 DIAGNOSIS — Z3042 Encounter for surveillance of injectable contraceptive: Secondary | ICD-10-CM | POA: Diagnosis not present

## 2020-01-03 DIAGNOSIS — R7989 Other specified abnormal findings of blood chemistry: Secondary | ICD-10-CM

## 2020-01-03 LAB — POCT URINE PREGNANCY: Preg Test, Ur: NEGATIVE

## 2020-01-03 MED ORDER — MEDROXYPROGESTERONE ACETATE 150 MG/ML IM SUSP
150.0000 mg | Freq: Once | INTRAMUSCULAR | Status: AC
  Administered 2020-01-03: 150 mg via INTRAMUSCULAR

## 2020-01-03 NOTE — Telephone Encounter (Signed)
Patient in for depo today. Patient asking for prescription for depo to go to [facility] on [address] so she can bring with her to her appointments. RN advised would send request to Dr. Quincy Simmonds. Patient agreeable.   Routing to provider for review.

## 2020-01-03 NOTE — Progress Notes (Signed)
Patient is here to restart Depo Provera Injection Patient is within Depo Provera Calender Limits no, restarting. UPT= negative. LMP: 12-27-19. Female partner.  Next Depo Due between: 7-26 to 8-9  Last AEX: 12-20-19 BS AEX Scheduled: 12-21-20   Patient is aware when next depo is due  Pt tolerated Injection well in [place].   Routed to provider for review, encounter closed.

## 2020-01-04 LAB — BASIC METABOLIC PANEL
BUN/Creatinine Ratio: 18 (ref 9–23)
BUN: 10 mg/dL (ref 6–24)
CO2: 23 mmol/L (ref 20–29)
Calcium: 9.1 mg/dL (ref 8.7–10.2)
Chloride: 104 mmol/L (ref 96–106)
Creatinine, Ser: 0.56 mg/dL — ABNORMAL LOW (ref 0.57–1.00)
GFR calc Af Amer: 128 mL/min/{1.73_m2} (ref >59)
GFR calc non Af Amer: 111 mL/min/{1.73_m2} (ref >59)
Glucose: 88 mg/dL (ref 65–99)
Potassium: 4.3 mmol/L (ref 3.5–5.2)
Sodium: 143 mmol/L (ref 134–144)

## 2020-01-04 NOTE — Telephone Encounter (Signed)
[place] for Depo Provera 150 mg IM q 3 months to go to her local pharmacy Disp:  1 RF:  2  Please let her know that the medication will need to be administered here in this office and not by another office or person.

## 2020-01-05 MED ORDER — MEDROXYPROGESTERONE ACETATE 150 MG/ML IM SUSP
150.0000 mg | INTRAMUSCULAR | 2 refills | Status: DC
Start: 2020-01-05 — End: 2020-06-19

## 2020-01-05 NOTE — Telephone Encounter (Signed)
Prescription for Depo-Provera 150mg , #1,2RF sent to confirmed pharmacy.   Call to patient and updated patient prescription to pharmacy. Patient verbalized understanding and agreeable. Aware depo must be administered in our office. Patient agreeable.   Encounter closed.

## 2020-03-15 ENCOUNTER — Telehealth: Payer: Self-pay | Admitting: Obstetrics and Gynecology

## 2020-03-15 NOTE — Telephone Encounter (Signed)
Patient believes she has a bacterial infection and would like an appointment next week. She is out of town this week.

## 2020-03-15 NOTE — Telephone Encounter (Signed)
Spoke with patient. Reports vaginal odor and thin, cloudy vaginal d/c. Symptoms have been present for approximately 1 wk. Requesting OV. Denies pain, irregular vaginal bleeding, fever/chills, N/V, urinary symptoms. Depo-provera for contraceptive, no menses with Depo. No STD concerns per patient. OV scheduled for 03/23/20 at 4:30pm with Dr. Quincy Simmonds.   Last AEX 12/20/19  Routing to provider for final review. Patient is agreeable to disposition. Will close encounter.

## 2020-03-23 ENCOUNTER — Encounter: Payer: Self-pay | Admitting: Obstetrics and Gynecology

## 2020-03-23 ENCOUNTER — Other Ambulatory Visit: Payer: Self-pay

## 2020-03-23 ENCOUNTER — Ambulatory Visit: Payer: 59 | Admitting: Obstetrics and Gynecology

## 2020-03-23 VITALS — BP 138/88 | HR 80 | Resp 14 | Ht 68.0 in | Wt 279.0 lb

## 2020-03-23 DIAGNOSIS — R635 Abnormal weight gain: Secondary | ICD-10-CM | POA: Diagnosis not present

## 2020-03-23 DIAGNOSIS — N760 Acute vaginitis: Secondary | ICD-10-CM | POA: Diagnosis not present

## 2020-03-23 DIAGNOSIS — N898 Other specified noninflammatory disorders of vagina: Secondary | ICD-10-CM | POA: Diagnosis not present

## 2020-03-23 MED ORDER — FLUCONAZOLE 150 MG PO TABS: 150.0000 mg | ORAL_TABLET | Freq: Once | ORAL | 0 refills | Status: AC

## 2020-03-23 NOTE — Progress Notes (Signed)
GYNECOLOGY  VISIT   HPI: 49 y.o.   Single  African American  female   [id number] with No LMP recorded (lmp unknown). Patient has had an injection.   here for vaginitis. Patient states that she has had a vaginal odor      No vaginal irritation, discharge, or burning.   Having vulvar itching and irritation.   Wants to loose weight. She attributes weight gain to decreased exercise and some ETOH consumption. Working out at [employer] again.  Vaccinated against Covid.   GYNECOLOGIC HISTORY: No LMP recorded (lmp unknown). Patient has had an injection. Contraception:  Depo-Provera Menopausal hormone therapy:  none Last mammogram:  12/20/19 BIRADS 1 negative/density b Last pap smear:   12/20/19 Neg:Neg HR HPV        OB History    Gravida  0   Para  0   Term  0   Preterm  0   AB  0   Living  0     SAB  0   TAB  0   Ectopic  0   Multiple  0   Live Births  0              Patient Active Problem List   Diagnosis Date Noted  . Leukocytoclastic vasculitis ([place]) 09/16/2016  . Alcohol use 09/13/2016  . Vasculitis on skin biopsy ([place]) 07/02/2016  . Episcleritis of both eyes 07/02/2016  . High risk medication use 07/02/2016  . Asthma 07/02/2016  . Allergic rhinitis 07/02/2016  . Noncompliance 07/02/2016  . History of DVT (deep vein thrombosis) 07/02/2016  . OP (osteoporosis) 07/02/2016  . Smoker 07/02/2016    Past Medical History:  Diagnosis Date  . Bone spur of left foot   . Dysmenorrhea   . Episcleritis of both eyes   . Fibroid   . H/O vasculitis    in legs  . Scleritis     Past Surgical History:  Procedure Laterality Date  . broken foot Right   . fracture of index finger Left     Current Outpatient Medications  Medication Sig Dispense Refill  . fexofenadine (ALLEGRA) 180 MG tablet Take 180 mg by mouth daily.    . medroxyPROGESTERone (DEPO-PROVERA) 150 MG/ML injection Inject 1 mL (150 mg total) into the muscle every 3 (three) months. 1 mL 2  . nystatin  (MYCOSTATIN/NYSTOP) powder Apply 1 application topically 3 (three) times daily. Apply to affected area for up to 7 days 30 g 3   No current facility-administered medications for this visit.     ALLERGIES: Patient has no known allergies.  Family History  Adopted: Yes  Problem Relation Age of Onset  . Diabetes Mother   . Asthma Mother   . Breast cancer Mother   . Diabetes Maternal Grandmother   . Asthma Maternal Grandmother   . Breast cancer Maternal Grandmother   . Diabetes Maternal Grandfather     Social History   Socioeconomic History  . Marital status: Single    Spouse name: Not on file  . Number of children: Not on file  . Years of education: Not on file  . Highest education level: Not on file  Occupational History  . Not on file  Tobacco Use  . Smoking status: Former Smoker    Packs/day: 0.25    Types: Cigarettes    Quit date: 10/25/2018    Years since quitting: 1.4  . Smokeless tobacco: Never Used  . Tobacco comment: 5-7 cigarettes/night  Vaping Use  .  Vaping Use: Never used  Substance and Sexual Activity  . Alcohol use: Yes    Alcohol/week: 3.0 standard drinks    Types: 3 Standard drinks or equivalent per week    Comment: socially  . Drug use: No  . Sexual activity: Yes    Partners: Female    Birth control/protection: None  Other Topics Concern  . Not on file  Social History Narrative  . Not on file   Social Determinants of Health   Financial Resource Strain:   . Difficulty of Paying Living Expenses:   Food Insecurity:   . Worried About Charity fundraiser in the Last Year:   . Arboriculturist in the Last Year:   Transportation Needs:   . Film/video editor (Medical):   Marland Kitchen Lack of Transportation (Non-Medical):   Physical Activity:   . Days of Exercise per Week:   . Minutes of Exercise per Session:   Stress:   . Feeling of Stress :   Social Connections:   . Frequency of Communication with Friends and Family:   . Frequency of Social  Gatherings with Friends and Family:   . Attends Religious Services:   . Active Member of Clubs or Organizations:   . Attends Archivist Meetings:   Marland Kitchen Marital Status:   Intimate Partner Violence:   . Fear of Current or Ex-Partner:   . Emotionally Abused:   Marland Kitchen Physically Abused:   . Sexually Abused:     Review of Systems  Constitutional: Negative.   HENT: Negative.   Eyes: Negative.   Respiratory: Negative.   Cardiovascular: Negative.   Gastrointestinal: Negative.   Endocrine: Negative.   Genitourinary:       Vaginal odor  Musculoskeletal: Negative.   Skin: Negative.   Allergic/Immunologic: Negative.   Neurological: Negative.   Hematological: Negative.   Psychiatric/Behavioral: Negative.     PHYSICAL EXAMINATION:    BP (!) 138/88 (BP Location: Right Arm, Patient Position: Sitting, Cuff Size: Large)   Pulse 80   Resp 14   Ht 5\' 8"  (1.727 m)   Wt (!) 279 lb (126.6 kg)   LMP  (LMP Unknown)   BMI 42.42 kg/m     General appearance: alert, cooperative and appears stated age   Pelvic: External genitalia:  Grey skin tone of right vulva.              Urethra:  normal appearing urethra with no masses, tenderness or lesions              Bartholins and Skenes: normal                 Vagina: normal appearing vagina with normal color and small amount of white discharge, no lesions              Cervix: no lesions                Bimanual Exam:  Uterus:  normal size, contour, position, consistency, mobility, non-tender              Adnexa: no mass, fullness, tenderness            Chaperone was present for exam.  ASSESSMENT  Vulvovaginitis.   Looks like yeast on vulva. Vaginal odor. Weight gain.  PLAN  Affirm  Diflucan course. If irritation does not resolve, would do Rx for triamcinolone.  Weight loss plan discussed through health diet, decreased ETOH consumption, and exercise. FU prn.   20  minute consultation.

## 2020-03-23 NOTE — Patient Instructions (Signed)
Exercising to Lose Weight Exercise is structured, repetitive physical activity to improve fitness and health. Getting regular exercise is important for everyone. It is especially important if you are overweight. Being overweight increases your risk of heart disease, stroke, diabetes, high blood pressure, and several types of cancer. Reducing your calorie intake and exercising can help you lose weight. Exercise is usually categorized as moderate or vigorous intensity. To lose weight, most people need to do a certain amount of moderate-intensity or vigorous-intensity exercise each week. Moderate-intensity exercise  Moderate-intensity exercise is any activity that gets you moving enough to burn at least three times more energy (calories) than if you were sitting. Examples of moderate exercise include:  Walking a mile in 15 minutes.  Doing light yard work.  Biking at an easy pace. Most people should get at least 150 minutes (2 hours and 30 minutes) a week of moderate-intensity exercise to maintain their body weight. Vigorous-intensity exercise Vigorous-intensity exercise is any activity that gets you moving enough to burn at least six times more calories than if you were sitting. When you exercise at this intensity, you should be working hard enough that you are not able to carry on a conversation. Examples of vigorous exercise include:  Running.  Playing a team sport, such as football, basketball, and soccer.  Jumping rope. Most people should get at least 75 minutes (1 hour and 15 minutes) a week of vigorous-intensity exercise to maintain their body weight. How can exercise affect me? When you exercise enough to burn more calories than you eat, you lose weight. Exercise also reduces body fat and builds muscle. The more muscle you have, the more calories you burn. Exercise also:  Improves mood.  Reduces stress and tension.  Improves your overall fitness, flexibility, and  endurance.  Increases bone strength. The amount of exercise you need to lose weight depends on:  Your age.  The type of exercise.  Any health conditions you have.  Your overall physical ability. Talk to your health care provider about how much exercise you need and what types of activities are safe for you. What actions can I take to lose weight? Nutrition   Make changes to your diet as told by your health care provider or diet and nutrition specialist (dietitian). This may include: ? Eating fewer calories. ? Eating more protein. ? Eating less unhealthy fats. ? Eating a diet that includes fresh fruits and vegetables, whole grains, low-fat dairy products, and lean protein. ? Avoiding foods with added fat, salt, and sugar.  Drink plenty of water while you exercise to prevent dehydration or heat stroke. Activity  Choose an activity that you enjoy and set realistic goals. Your health care provider can help you make an exercise plan that works for you.  Exercise at a moderate or vigorous intensity most days of the week. ? The intensity of exercise may vary from person to person. You can tell how intense a workout is for you by paying attention to your breathing and heartbeat. Most people will notice their breathing and heartbeat get faster with more intense exercise.  Do resistance training twice each week, such as: ? Push-ups. ? Sit-ups. ? Lifting weights. ? Using resistance bands.  Getting short amounts of exercise can be just as helpful as long structured periods of exercise. If you have trouble finding time to exercise, try to include exercise in your daily routine. ? Get up, stretch, and walk around every 30 minutes throughout the day. ? Go for a  walk during your lunch break. ? Park your car farther away from your destination. ? If you take public transportation, get off one stop early and walk the rest of the way. ? Make phone calls while standing up and walking  around. ? Take the stairs instead of elevators or escalators.  Wear comfortable clothes and shoes with good support.  Do not exercise so much that you hurt yourself, feel dizzy, or get very short of breath. Where to find more information  U.S. Department of Health and Human Services: www.hhs.gov  Centers for Disease Control and Prevention (CDC): www.cdc.gov Contact a health care provider:  Before starting a new exercise program.  If you have questions or concerns about your weight.  If you have a medical problem that keeps you from exercising. Get help right away if you have any of the following while exercising:  Injury.  Dizziness.  Difficulty breathing or shortness of breath that does not go away when you stop exercising.  Chest pain.  Rapid heartbeat. Summary  Being overweight increases your risk of heart disease, stroke, diabetes, high blood pressure, and several types of cancer.  Losing weight happens when you burn more calories than you eat.  Reducing the amount of calories you eat in addition to getting regular moderate or vigorous exercise each week helps you lose weight. This information is not intended to replace advice given to you by your health care provider. Make sure you discuss any questions you have with your health care provider. Document Revised: 08/25/2017 Document Reviewed: 08/25/2017 Elsevier Patient Education  2020 Elsevier Inc.   Healthy Eating Following a healthy eating pattern may help you to achieve and maintain a healthy body weight, reduce the risk of chronic disease, and live a long and productive life. It is important to follow a healthy eating pattern at an appropriate calorie level for your body. Your nutritional needs should be met primarily through food by choosing a variety of nutrient-rich foods. What are tips for following this plan? Reading food labels  Read labels and choose the following: ? Reduced or low sodium. ? Juices with  100% fruit juice. ? Foods with low saturated fats and high polyunsaturated and monounsaturated fats. ? Foods with whole grains, such as whole wheat, cracked wheat, brown rice, and wild rice. ? Whole grains that are fortified with folic acid. This is recommended for women who are pregnant or who want to become pregnant.  Read labels and avoid the following: ? Foods with a lot of added sugars. These include foods that contain brown sugar, corn sweetener, corn syrup, dextrose, fructose, glucose, high-fructose corn syrup, honey, invert sugar, lactose, malt syrup, maltose, molasses, raw sugar, sucrose, trehalose, or turbinado sugar.  Do not eat more than the following amounts of added sugar per day:  6 teaspoons (25 g) for women.  9 teaspoons (38 g) for men. ? Foods that contain processed or refined starches and grains. ? Refined grain products, such as white flour, degermed cornmeal, white bread, and white rice. Shopping  Choose nutrient-rich snacks, such as vegetables, whole fruits, and nuts. Avoid high-calorie and high-sugar snacks, such as potato chips, fruit snacks, and candy.  Use oil-based dressings and spreads on foods instead of solid fats such as butter, stick margarine, or cream cheese.  Limit pre-made sauces, mixes, and "instant" products such as flavored rice, instant noodles, and ready-made pasta.  Try more plant-protein sources, such as tofu, tempeh, black beans, edamame, lentils, nuts, and seeds.  Explore eating plans   such as the Mediterranean diet or vegetarian diet. Cooking  Use oil to saut or stir-fry foods instead of solid fats such as butter, stick margarine, or lard.  Try baking, boiling, grilling, or broiling instead of frying.  Remove the fatty part of meats before cooking.  Steam vegetables in water or broth. Meal planning   At meals, imagine dividing your plate into fourths: ? One-half of your plate is fruits and vegetables. ? One-fourth of your plate is  whole grains. ? One-fourth of your plate is protein, especially lean meats, poultry, eggs, tofu, beans, or nuts.  Include low-fat dairy as part of your daily diet. Lifestyle  Choose healthy options in all settings, including home, work, school, restaurants, or stores.  Prepare your food safely: ? Wash your hands after handling raw meats. ? Keep food preparation surfaces clean by regularly washing with hot, soapy water. ? Keep raw meats separate from ready-to-eat foods, such as fruits and vegetables. ? Cook seafood, meat, poultry, and eggs to the recommended internal temperature. ? Store foods at safe temperatures. In general:  Keep cold foods at 40F (4.4C) or below.  Keep hot foods at 140F (60C) or above.  Keep your freezer at 0F (-17.8C) or below.  Foods are no longer safe to eat when they have been between the temperatures of 40-140F (4.4-60C) for more than 2 hours. What foods should I eat? Fruits Aim to eat 2 cup-equivalents of fresh, canned (in natural juice), or frozen fruits each day. Examples of 1 cup-equivalent of fruit include 1 small apple, 8 large strawberries, 1 cup canned fruit,  cup dried fruit, or 1 cup 100% juice. Vegetables Aim to eat 2-3 cup-equivalents of fresh and frozen vegetables each day, including different varieties and colors. Examples of 1 cup-equivalent of vegetables include 2 medium carrots, 2 cups raw, leafy greens, 1 cup chopped vegetable (raw or cooked), or 1 medium baked potato. Grains Aim to eat 6 ounce-equivalents of whole grains each day. Examples of 1 ounce-equivalent of grains include 1 slice of bread, 1 cup ready-to-eat cereal, 3 cups popcorn, or  cup cooked rice, pasta, or cereal. Meats and other proteins Aim to eat 5-6 ounce-equivalents of protein each day. Examples of 1 ounce-equivalent of protein include 1 egg, 1/2 cup nuts or seeds, or 1 tablespoon (16 g) peanut butter. A cut of meat or fish that is the size of a deck of cards is  about 3-4 ounce-equivalents.  Of the protein you eat each week, try to have at least 8 ounces come from seafood. This includes salmon, trout, herring, and anchovies. Dairy Aim to eat 3 cup-equivalents of fat-free or low-fat dairy each day. Examples of 1 cup-equivalent of dairy include 1 cup (240 mL) milk, 8 ounces (250 g) yogurt, 1 ounces (44 g) natural cheese, or 1 cup (240 mL) fortified soy milk. Fats and oils  Aim for about 5 teaspoons (21 g) per day. Choose monounsaturated fats, such as canola and olive oils, avocados, peanut butter, and most nuts, or polyunsaturated fats, such as sunflower, corn, and soybean oils, walnuts, pine nuts, sesame seeds, sunflower seeds, and flaxseed. Beverages  Aim for six 8-oz glasses of water per day. Limit coffee to three to five 8-oz cups per day.  Limit caffeinated beverages that have added calories, such as soda and energy drinks.  Limit alcohol intake to no more than 1 drink a day for nonpregnant women and 2 drinks a day for men. One drink equals 12 oz of beer (355 mL),   5 oz of wine (148 mL), or 1 oz of hard liquor (44 mL). Seasoning and other foods  Avoid adding excess amounts of salt to your foods. Try flavoring foods with herbs and spices instead of salt.  Avoid adding sugar to foods.  Try using oil-based dressings, sauces, and spreads instead of solid fats. This information is based on general [place] nutrition guidelines. For more information, visit choosemyplate.gov. Exact amounts may vary based on your nutrition needs. Summary  A healthy eating plan may help you to maintain a healthy weight, reduce the risk of chronic diseases, and stay active throughout your life.  Plan your meals. Make sure you eat the right portions of a variety of nutrient-rich foods.  Try baking, boiling, grilling, or broiling instead of frying.  Choose healthy options in all settings, including home, work, school, restaurants, or stores. This information is not  intended to replace advice given to you by your health care provider. Make sure you discuss any questions you have with your health care provider. Document Revised: 11/24/2017 Document Reviewed: 11/24/2017 Elsevier Patient Education  2020 Elsevier Inc.    

## 2020-03-24 LAB — VAGINITIS/VAGINOSIS, DNA PROBE
Candida Species: NEGATIVE
Gardnerella vaginalis: NEGATIVE
Trichomonas vaginosis: NEGATIVE

## 2020-04-03 ENCOUNTER — Other Ambulatory Visit: Payer: Self-pay

## 2020-04-03 ENCOUNTER — Ambulatory Visit (INDEPENDENT_AMBULATORY_CARE_PROVIDER_SITE_OTHER): Payer: 59

## 2020-04-03 VITALS — BP 120/76 | HR 70 | Resp 16 | Wt 285.0 lb

## 2020-04-03 DIAGNOSIS — Z3042 Encounter for surveillance of injectable contraceptive: Secondary | ICD-10-CM

## 2020-04-03 MED ORDER — MEDROXYPROGESTERONE ACETATE 150 MG/ML IM SUSP
150.0000 mg | Freq: Once | INTRAMUSCULAR | Status: AC
  Administered 2020-04-03: 150 mg via INTRAMUSCULAR

## 2020-04-03 NOTE — Progress Notes (Signed)
Patient is here for Depo Provera Injection Patient is within Depo Provera Calender Limits 7/26-8/9 Next Depo Due between: 10/25-11/8 Last AEX: 12-20-2019 with Dr Leretha Dykes Scheduled: 12-21-2020  Patient is aware when next depo is due  Pt tolerated Injection well in [place].  Routed to provider for review, encounter closed.

## 2020-06-19 ENCOUNTER — Ambulatory Visit (INDEPENDENT_AMBULATORY_CARE_PROVIDER_SITE_OTHER): Payer: No Typology Code available for payment source

## 2020-06-19 ENCOUNTER — Other Ambulatory Visit: Payer: Self-pay

## 2020-06-19 VITALS — BP 128/70 | HR 89 | Resp 12 | Ht 71.0 in | Wt 293.0 lb

## 2020-06-19 DIAGNOSIS — Z3042 Encounter for surveillance of injectable contraceptive: Secondary | ICD-10-CM

## 2020-06-19 MED ORDER — MEDROXYPROGESTERONE ACETATE 150 MG/ML IM SUSP
150.0000 mg | Freq: Once | INTRAMUSCULAR | Status: AC
  Administered 2020-06-19: 150 mg via INTRAMUSCULAR

## 2020-06-19 NOTE — Progress Notes (Signed)
Patient is here for Depo Provera Injection Patient is within Depo Provera Calender Limits yes Next Depo Due between: 1/10-1/24 Last AEX: 12/20/19 Dr. Leretha Dykes Scheduled: 12/21/20  Patient is aware when next depo is due  Pt tolerated Injection well in [place].  Routed to provider for review, encounter closed.

## 2020-09-04 ENCOUNTER — Other Ambulatory Visit: Payer: Self-pay

## 2020-09-04 ENCOUNTER — Ambulatory Visit (INDEPENDENT_AMBULATORY_CARE_PROVIDER_SITE_OTHER): Payer: BC Managed Care – PPO

## 2020-09-04 VITALS — BP 126/80 | HR 84 | Ht 67.0 in | Wt 284.0 lb

## 2020-09-04 DIAGNOSIS — Z3042 Encounter for surveillance of injectable contraceptive: Secondary | ICD-10-CM | POA: Diagnosis not present

## 2020-09-04 MED ORDER — MEDROXYPROGESTERONE ACETATE 150 MG/ML IM SUSP
150.0000 mg | Freq: Once | INTRAMUSCULAR | Status: AC
  Administered 2020-09-04: 150 mg via INTRAMUSCULAR

## 2020-09-04 NOTE — Progress Notes (Signed)
Patient is here for Depo Provera Injection Patient is within Depo Provera Calender Limits yes Next Depo Due between: 3/28-4/11 Last AEX: 12/20/19 Dr. Leretha Dykes Scheduled: 12/21/20  Patient is aware when next depo is due  Pt tolerated Injection well [facility].  Routed to provider for review, encounter closed.

## 2020-09-21 DIAGNOSIS — J019 Acute sinusitis, unspecified: Secondary | ICD-10-CM | POA: Diagnosis not present

## 2020-09-21 DIAGNOSIS — R0981 Nasal congestion: Secondary | ICD-10-CM | POA: Diagnosis not present

## 2020-09-21 DIAGNOSIS — R059 Cough, unspecified: Secondary | ICD-10-CM | POA: Diagnosis not present

## 2020-10-05 ENCOUNTER — Ambulatory Visit (HOSPITAL_COMMUNITY)
Admission: RE | Admit: 2020-10-05 | Discharge: 2020-10-05 | Disposition: A | Discharge status: Home / Self Care | Payer: BC Managed Care – PPO | Source: Ambulatory Visit | Attending: Urgent Care | Admitting: Urgent Care

## 2020-10-05 ENCOUNTER — Other Ambulatory Visit: Payer: Self-pay

## 2020-10-05 ENCOUNTER — Encounter (HOSPITAL_COMMUNITY): Payer: Self-pay

## 2020-10-05 ENCOUNTER — Ambulatory Visit (INDEPENDENT_AMBULATORY_CARE_PROVIDER_SITE_OTHER): Payer: BC Managed Care – PPO

## 2020-10-05 VITALS — BP 165/90 | HR 73 | Temp 98.2°F | Resp 20

## 2020-10-05 DIAGNOSIS — R059 Cough, unspecified: Secondary | ICD-10-CM

## 2020-10-05 DIAGNOSIS — J3089 Other allergic rhinitis: Secondary | ICD-10-CM | POA: Diagnosis not present

## 2020-10-05 MED ORDER — MONTELUKAST SODIUM 10 MG PO TABS: 10.0000 mg | ORAL_TABLET | Freq: Every day | ORAL | 0 refills | Status: DC

## 2020-10-05 MED ORDER — CETIRIZINE HCL 10 MG PO TABS: 10.0000 mg | ORAL_TABLET | Freq: Every day | ORAL | 0 refills | Status: DC

## 2020-10-05 MED ORDER — PREDNISONE 20 MG PO TABS: ORAL_TABLET | ORAL | 0 refills | Status: DC

## 2020-10-05 NOTE — ED Provider Notes (Signed)
[place]   MRN: [id number] DOB: 25-Nov-1970  Subjective:   Joyce Zhang is a 50 y.o. female presenting for 1 month history of persistent sinus congestion, sinus inflammation.  She had a visit at the end of January and was prescribed Augmentin for 10 days.  She ended up taking this and cleared up her symptoms for about a week but now her symptoms have returned and she has a really bothersome cough that is worse at night.  Still has persistent sinus congestion.  Denies fever, chest pain, shortness of breath.  Patient is not a current smoker.  No current facility-administered medications for this encounter.  Current Outpatient Medications:  .  fexofenadine (ALLEGRA) 180 MG tablet, Take 180 mg by mouth daily., Disp: , Rfl:    No Known Allergies  Past Medical History:  Diagnosis Date  . Bone spur of left foot   . Dysmenorrhea   . Episcleritis of both eyes   . Fibroid   . H/O vasculitis    in legs  . Scleritis      Past Surgical History:  Procedure Laterality Date  . broken foot Right   . fracture of index finger Left     Family History  Adopted: Yes  Problem Relation Age of Onset  . Diabetes Mother   . Asthma Mother   . Breast cancer Mother   . Diabetes Maternal Grandmother   . Asthma Maternal Grandmother   . Breast cancer Maternal Grandmother   . Diabetes Maternal Grandfather     Social History   Tobacco Use  . Smoking status: Former Smoker    Packs/day: 0.25    Types: Cigarettes    Quit date: 10/25/2018    Years since quitting: 1.9  . Smokeless tobacco: Never Used  . Tobacco comment: 5-7 cigarettes/night  Vaping Use  . Vaping Use: Never used  Substance Use Topics  . Alcohol use: Yes    Alcohol/week: 3.0 standard drinks    Types: 3 Standard drinks or equivalent per week    Comment: socially  . Drug use: No    ROS   Objective:   Vitals: BP (!) 165/90 (BP Location: Right Arm) Comment (BP Location): large cuff  Pulse 73    Temp 98.2 F (36.8 C) (Oral)   Resp 20   SpO2 100%   Physical Exam Constitutional:      General: She is not in acute distress.    Appearance: Normal appearance. She is well-developed. She is not ill-appearing, toxic-appearing or diaphoretic.  HENT:     Head: Normocephalic and atraumatic.     Nose: Congestion and rhinorrhea present.     Mouth/Throat:     Mouth: Mucous membranes are moist.  Eyes:     Extraocular Movements: Extraocular movements intact.     Pupils: Pupils are equal, round, and reactive to light.  Cardiovascular:     Rate and Rhythm: Normal rate and regular rhythm.     Pulses: Normal pulses.     Heart sounds: Normal heart sounds. No murmur heard. No friction rub. No gallop.   Pulmonary:     Effort: Pulmonary effort is normal. No respiratory distress.     Breath sounds: Normal breath sounds. No stridor. No wheezing, rhonchi or rales.  Skin:    General: Skin is warm and dry.     Findings: No rash.  Neurological:     Mental Status: She is alert and oriented to person, place, and time.  Psychiatric:  Mood and Affect: Mood normal.        Behavior: Behavior normal.        Thought Content: Thought content normal.        Judgment: Judgment normal.     DG Chest 2 View  Result Date: 10/05/2020 CLINICAL DATA:  Cough. EXAM: CHEST - 2 VIEW COMPARISON:  October 16, 2010. FINDINGS: The heart size and mediastinal contours are within normal limits. Both lungs are clear. No visible pleural effusions or pneumothorax. No acute osseous abnormality. IMPRESSION: No active cardiopulmonary disease. Electronically Signed   By: Margaretha Sheffield MD   On: 10/05/2020 11:53    Assessment and Plan :   PDMP not reviewed this encounter.  1. Allergic rhinitis due to other allergic trigger, unspecified seasonality   2. Cough     Recommended prednisone course for allergic rhinitis. Start Zyrtec, stop Allegra. Add Singulair. Follow up with PCP. Counseled patient on potential for  adverse effects with medications prescribed/recommended today, ER and return-to-clinic precautions discussed, patient verbalized understanding.    Jaynee Eagles, PA-C 10/05/20 1218

## 2020-10-05 NOTE — ED Triage Notes (Signed)
Patient sinus infection at the beginning of January.  Took otc medicines.  Symptoms improved, but after one week cough and nasal congestion returned and will not go away.  Patient has a cough, minimal phlegm.  Cough is worse at night. At night, what ever side she lies on is the side of nose that is stuffy.  Denies fever.

## 2020-11-14 ENCOUNTER — Other Ambulatory Visit: Payer: Self-pay | Admitting: Obstetrics and Gynecology

## 2020-11-14 DIAGNOSIS — Z1231 Encounter for screening mammogram for malignant neoplasm of breast: Secondary | ICD-10-CM

## 2020-12-04 ENCOUNTER — Other Ambulatory Visit: Payer: Self-pay

## 2020-12-04 ENCOUNTER — Ambulatory Visit (INDEPENDENT_AMBULATORY_CARE_PROVIDER_SITE_OTHER): Payer: BC Managed Care – PPO | Admitting: *Deleted

## 2020-12-04 DIAGNOSIS — Z3042 Encounter for surveillance of injectable contraceptive: Secondary | ICD-10-CM | POA: Diagnosis not present

## 2020-12-04 MED ORDER — MEDROXYPROGESTERONE ACETATE 150 MG/ML IM SUSP
150.0000 mg | Freq: Once | INTRAMUSCULAR | Status: AC
  Administered 2020-12-04: 150 mg via INTRAMUSCULAR

## 2020-12-21 ENCOUNTER — Ambulatory Visit: Payer: 59 | Admitting: Obstetrics and Gynecology

## 2020-12-29 DIAGNOSIS — Z1231 Encounter for screening mammogram for malignant neoplasm of breast: Secondary | ICD-10-CM

## 2021-01-24 ENCOUNTER — Other Ambulatory Visit: Payer: Self-pay

## 2021-01-24 ENCOUNTER — Ambulatory Visit: Payer: BC Managed Care – PPO | Admitting: Nurse Practitioner

## 2021-01-24 ENCOUNTER — Encounter: Payer: Self-pay | Admitting: Nurse Practitioner

## 2021-01-24 VITALS — BP 122/80 | HR 88 | Temp 98.6°F | Ht 66.6 in | Wt 275.4 lb

## 2021-01-24 DIAGNOSIS — L739 Follicular disorder, unspecified: Secondary | ICD-10-CM | POA: Diagnosis not present

## 2021-01-24 DIAGNOSIS — Z6841 Body Mass Index (BMI) 40.0 and over, adult: Secondary | ICD-10-CM

## 2021-01-24 DIAGNOSIS — Z8709 Personal history of other diseases of the respiratory system: Secondary | ICD-10-CM

## 2021-01-24 DIAGNOSIS — Z7689 Persons encountering health services in other specified circumstances: Secondary | ICD-10-CM

## 2021-01-24 DIAGNOSIS — E662 Morbid (severe) obesity with alveolar hypoventilation: Secondary | ICD-10-CM | POA: Diagnosis not present

## 2021-01-24 MED ORDER — TRIAMCINOLONE ACETONIDE 0.025 % EX OINT: 1.0000 "application " | TOPICAL_OINTMENT | Freq: Two times a day (BID) | CUTANEOUS | 0 refills | Status: DC

## 2021-01-24 MED ORDER — MONTELUKAST SODIUM 10 MG PO TABS: 10.0000 mg | ORAL_TABLET | Freq: Every day | ORAL | 1 refills | Status: DC

## 2021-01-24 MED ORDER — CETIRIZINE HCL 10 MG PO TABS: 10.0000 mg | ORAL_TABLET | Freq: Every day | ORAL | 0 refills | Status: DC

## 2021-01-24 MED ORDER — CETIRIZINE HCL 10 MG PO TABS: 10.0000 mg | ORAL_TABLET | Freq: Every day | ORAL | 1 refills | Status: AC

## 2021-01-24 MED ORDER — CEPHALEXIN 500 MG PO CAPS: 500.0000 mg | ORAL_CAPSULE | Freq: Four times a day (QID) | ORAL | 0 refills | Status: AC

## 2021-01-24 MED ORDER — MONTELUKAST SODIUM 10 MG PO TABS: 10.0000 mg | ORAL_TABLET | Freq: Every day | ORAL | 0 refills | Status: DC

## 2021-01-24 NOTE — Progress Notes (Signed)
I,Yamilka Roman [employer] as a Education administrator for [employer], FNP.,have documented all relevant documentation on the behalf of Minette Brine, FNP,as directed by  Minette Brine, FNP while in the presence of Minette Brine, Learned. This visit occurred during the SARS-CoV-2 public health emergency.  Safety protocols were in place, including screening questions prior to the visit, additional usage of staff PPE, and extensive cleaning of exam room while observing appropriate contact time as indicated for disinfecting solutions.  Subjective:     Patient ID: Joyce Zhang , female    DOB: 13-Jun-1971 , 50 y.o.   MRN: [id number]   Chief Complaint  Patient presents with   Establish Care   Other    HPI  Patient presents today to establish primary care.  She was seeing Dr. Precious Haws at [facility]. She also had a PCP in [place], [place] from 2018/2020 only seen once.  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  Single. No children. Lesbian.   She is on depo injections - Dr. Quincy Simmonds - OB/GYN. Jones Fx right foot.  She is going to the optomotrist for her routine eye.    She would like refills on her allergy medicine.   Mother - breast cancer dx late 26/50's Yearly mammograms       Past Medical History:  Diagnosis Date   Bone spur of left foot    Dysmenorrhea    Episcleritis of both eyes    Fibroid    H/O vasculitis    in legs   Scleritis      Family History  Adopted: Yes  Problem Relation Age of Onset   Diabetes Mother    Asthma Mother    Breast cancer Mother    Diabetes Maternal Grandmother    Asthma Maternal Grandmother    Diabetes Maternal Grandfather      Current Outpatient Medications:    triamcinolone (KENALOG) 0.025 % ointment, Apply 1 application topically 2 (two) times daily., Disp: 30 g, Rfl: 0   cetirizine (ZYRTEC ALLERGY) 10 MG tablet, Take 1 tablet (10 mg total) by mouth daily., Disp: 90 tablet, Rfl: 1   montelukast (SINGULAIR) 10 MG tablet,  Take 1 tablet (10 mg total) by mouth at bedtime., Disp: 90 tablet, Rfl: 1   No Known Allergies   Review of Systems  Constitutional: Negative.   Eyes: Negative.   Respiratory: Negative.    Cardiovascular: Negative.  Negative for chest pain, palpitations and leg swelling.  Musculoskeletal: Negative.   Skin: Negative.        3 weeks of irritation to pubic area skin after having a waking - has tried neosporin, hydrocortisone, aloe vera.   Hematological: Negative.   Psychiatric/Behavioral: Negative.      Today's Vitals   01/24/21 1536  BP: 122/80  Pulse: 88  Temp: 98.6 F (37 C)  SpO2: 96%  Weight: 275 lb 6.4 oz (124.9 kg)  Height: 5' 6.6" (1.692 m)  PainSc: 0-No pain   Body mass index is 43.65 kg/m.   Objective:  Physical Exam Vitals reviewed.  Constitutional:      General: She is not in acute distress.    Appearance: Normal appearance.  Cardiovascular:     Rate and Rhythm: Normal rate and regular rhythm.     Pulses: Normal pulses.     Heart sounds: Normal heart sounds. No murmur heard. Pulmonary:     Effort: Pulmonary effort is normal. No respiratory distress.     Breath sounds: Normal breath sounds.  No wheezing.  Skin:    General: Skin is warm and dry.     Capillary Refill: Capillary refill takes less than 2 seconds.     Findings: Rash (papular rash to pubic area) present.  Neurological:     General: No focal deficit present.     Mental Status: She is alert and oriented to person, place, and time.     Cranial Nerves: No cranial nerve deficit.  Psychiatric:        Mood and Affect: Mood normal.        Behavior: Behavior normal.        Thought Content: Thought content normal.        Judgment: Judgment normal.        Assessment And Plan:     1. Folliculitis Papular rash with small amount of erythema present to pubic area Will treat with antibiotics if not better return call to office - cephALEXin (KEFLEX) 500 MG capsule; Take 1 capsule (500 mg total) by mouth  4 (four) times daily for 10 days.  Dispense: 40 capsule; Refill: 0 - triamcinolone (KENALOG) 0.025 % ointment; Apply 1 application topically 2 (two) times daily.  Dispense: 30 g; Refill: 0  2. Class 3 obesity with alveolar hypoventilation and body mass index (BMI) of 40.0 to 44.9 in adult, unspecified whether serious comorbidity present (HCC) Chronic Discussed healthy diet and regular exercise options  Encouraged to exercise at least 150 minutes per week with 2 days of strength training  3. History of allergic rhinitis - cetirizine (ZYRTEC ALLERGY) 10 MG tablet; Take 1 tablet (10 mg total) by mouth daily.  Dispense: 90 tablet; Refill: 1 - montelukast (SINGULAIR) 10 MG tablet; Take 1 tablet (10 mg total) by mouth at bedtime.  Dispense: 90 tablet; Refill: 1  4. Establishing care with new doctor, encounter for     Patient was given opportunity to ask questions. Patient verbalized understanding of the plan and was able to repeat key elements of the plan. All questions were answered to their satisfaction.  Minette Brine, FNP   I, Minette Brine, FNP, have reviewed all documentation for this visit. The documentation on 01/24/21 for the exam, diagnosis, procedures, and orders are all accurate and complete.   IF YOU HAVE BEEN REFERRED TO A SPECIALIST, IT MAY TAKE 1-2 WEEKS TO SCHEDULE/PROCESS THE REFERRAL. IF YOU HAVE NOT HEARD FROM US/SPECIALIST IN TWO WEEKS, PLEASE GIVE [place] A CALL AT [phone].   THE PATIENT IS ENCOURAGED TO PRACTICE SOCIAL DISTANCING DUE TO THE COVID-19 PANDEMIC.

## 2021-01-31 ENCOUNTER — Ambulatory Visit: Payer: BC Managed Care – PPO | Admitting: Family Medicine

## 2021-02-16 ENCOUNTER — Ambulatory Visit
Admission: RE | Admit: 2021-02-16 | Discharge: 2021-02-16 | Disposition: A | Discharge status: Home / Self Care | Payer: BC Managed Care – PPO | Source: Ambulatory Visit | Attending: Obstetrics and Gynecology | Admitting: Obstetrics and Gynecology

## 2021-02-16 ENCOUNTER — Other Ambulatory Visit: Payer: Self-pay

## 2021-02-16 DIAGNOSIS — Z1231 Encounter for screening mammogram for malignant neoplasm of breast: Secondary | ICD-10-CM

## 2021-02-19 ENCOUNTER — Ambulatory Visit (INDEPENDENT_AMBULATORY_CARE_PROVIDER_SITE_OTHER): Payer: BC Managed Care – PPO | Admitting: *Deleted

## 2021-02-19 ENCOUNTER — Other Ambulatory Visit: Payer: Self-pay

## 2021-02-19 DIAGNOSIS — Z3042 Encounter for surveillance of injectable contraceptive: Secondary | ICD-10-CM

## 2021-02-19 MED ORDER — MEDROXYPROGESTERONE ACETATE 150 MG/ML IM SUSP
150.0000 mg | Freq: Once | INTRAMUSCULAR | Status: AC
Start: 2021-02-19 — End: 2021-02-19
  Administered 2021-02-19: 17:00:00 150 mg via INTRAMUSCULAR

## 2021-03-22 ENCOUNTER — Other Ambulatory Visit: Payer: Self-pay

## 2021-03-22 ENCOUNTER — Ambulatory Visit: Payer: BC Managed Care – PPO | Admitting: Nurse Practitioner

## 2021-03-22 ENCOUNTER — Encounter: Payer: Self-pay | Admitting: Nurse Practitioner

## 2021-03-22 VITALS — BP 140/80 | HR 82 | Temp 98.3°F | Ht 67.0 in | Wt 273.8 lb

## 2021-03-22 DIAGNOSIS — I776 Arteritis, unspecified: Secondary | ICD-10-CM | POA: Diagnosis not present

## 2021-03-22 NOTE — Progress Notes (Signed)
I,Joyce Zhang,acting as a Education administrator for [employer], NP.,have documented all relevant documentation on the behalf of [employer], NP,as directed by  Bary Castilla, NP while in the presence of Bary Castilla, NP.  This visit occurred during the SARS-CoV-2 public health emergency.  Safety protocols were in place, including screening questions prior to the visit, additional usage of staff PPE, and extensive cleaning of exam room while observing appropriate contact time as indicated for disinfecting solutions.  Subjective:     Patient ID: Joyce Zhang , female    DOB: April 22, 1971 , 50 y.o.   MRN: [id number]   Chief Complaint  Patient presents with   OTHER    Vasculitis     HPI  Patient is here for referral. She was previously seen by Dr Estanislado Pandy but she needs a new referral since it has been more than 3 years. She recently moved back from [place].      Past Medical History:  Diagnosis Date   Bone spur of left foot    Dysmenorrhea    Episcleritis of both eyes    Fibroid    H/O vasculitis    in legs   Scleritis      Family History  Adopted: Yes  Problem Relation Age of Onset   Diabetes Mother    Asthma Mother    Breast cancer Mother    Diabetes Maternal Grandmother    Asthma Maternal Grandmother    Diabetes Maternal Grandfather      Current Outpatient Medications:    cetirizine (ZYRTEC ALLERGY) 10 MG tablet, Take 1 tablet (10 mg total) by mouth daily., Disp: 90 tablet, Rfl: 1   montelukast (SINGULAIR) 10 MG tablet, Take 1 tablet (10 mg total) by mouth at bedtime., Disp: 90 tablet, Rfl: 1   triamcinolone (KENALOG) 0.025 % ointment, Apply 1 application topically 2 (two) times daily., Disp: 30 g, Rfl: 0   No Known Allergies   Review of Systems  Constitutional: Negative.  Negative for chills, fatigue and fever.  HENT:  Negative for congestion and rhinorrhea.   Respiratory: Negative.  Negative for cough, shortness of breath and wheezing.    Cardiovascular: Negative.  Negative for chest pain and palpitations.  Gastrointestinal: Negative.   Skin:        Vasculitis on her legs   Neurological: Negative.  Negative for dizziness, weakness and numbness.    Today's Vitals   03/22/21 1624  BP: 140/80  Pulse: 82  Temp: 98.3 F (36.8 C)  TempSrc: Oral  Weight: 273 lb 12.8 oz (124.2 kg)  Height: '5\' 7"'$  (1.702 m)   Body mass index is 42.88 kg/m.   Objective:  Physical Exam Constitutional:      Appearance: Normal appearance. She is obese.  HENT:     Head: Normocephalic and atraumatic.  Cardiovascular:     Rate and Rhythm: Normal rate and regular rhythm.     Pulses: Normal pulses.     Heart sounds: No murmur heard. Pulmonary:     Effort: Pulmonary effort is normal. No respiratory distress.     Breath sounds: Normal breath sounds. No wheezing.  Skin:    Capillary Refill: Capillary refill takes less than 2 seconds.     Findings: Rash is not pustular.     Comments: Vasculitis on her legs. Numerous amount of petechiae on her legs.   Neurological:     Mental Status: She is alert and oriented to person, place, and time.        Assessment And  Plan:     1. Vasculitis on skin biopsy ([place]) - Ambulatory referral to Rheumatology  -Pt. Has a history of vasculitis on her legs. Was seeing a rheumatologist about 3 years ago would like to go back to see them again. Referral put in.   Follow up: if symptoms persist or do not get better.   The patient was encouraged to call or send a message through [place] for any questions or concerns.   Patient was given opportunity to ask questions. Patient verbalized understanding of the plan and was able to repeat key elements of the plan. All questions were answered to their satisfaction.  Raman Dayelin Balducci, DNP   I, Raman Dacotah Cabello have reviewed all documentation for this visit. The documentation on 03/22/21 for the exam, diagnosis, procedures, and orders are all accurate and complete.    IF  YOU HAVE BEEN REFERRED TO A SPECIALIST, IT MAY TAKE 1-2 WEEKS TO SCHEDULE/PROCESS THE REFERRAL. IF YOU HAVE NOT HEARD FROM US/SPECIALIST IN TWO WEEKS, PLEASE GIVE [place] A CALL AT [phone].   THE PATIENT IS ENCOURAGED TO PRACTICE SOCIAL DISTANCING DUE TO THE COVID-19 PANDEMIC.

## 2021-03-22 NOTE — Patient Instructions (Signed)
Vasculitis  Vasculitis is inflammation of the blood vessels. With vasculitis, the blood vessels can become thick, narrow, scarred, or weak. Enough blood may not be able to flow through them. This can cause damage to the muscles, kidneys,lungs, brain, and other parts of the body. There are many types of vasculitis. The different types may affect different kinds of blood vessels or different areas of the body. Some types last only ashort time, while others last a long time. What are the causes? The exact cause of this condition is not known. However, vasculitis can develop when the body's defense system (immune system) attacks its own blood vessels. This attack can be caused by: An infection. An immune system disease, such as lupus, rheumatoid arthritis, or scleroderma. An allergic reaction to a medicine. A cancer that affects blood cells, such as leukemia or lymphoma. What increases the risk? The following factors may make you more likely to develop this condition: Being a smoker. Being under stress. Having a physical injury. What are the signs or symptoms? Symptoms of this condition depend on the type of vasculitis that you have. Symptoms that are common to all types of vasculitis include: Fever. Poor appetite. Weight loss. Feeling very tired (fatigue). Having aches and pains. Weakness. Numbness in an area of your body. Symptoms for specific types of vasculitis include: Skin problems, such as sores, spots, or rashes. Trouble seeing. Trouble breathing. Coughing up blood. Blood in your urine. Headaches. Stomach pain. Stuffy or bloody nose. How is this diagnosed? This condition may be diagnosed based on: Your symptoms. A physical exam. You may also have tests, including: Blood tests. A urine test. A biopsy of a blood vessel. A test to measure the electrical signals moving through nerves (nerve conduction study). Imaging tests, such as: X-rays. CT  scan. Ultrasound. MRI. Angiogram. How is this treated? Treatment for this condition will depend on the type of vasculitis that you have and how severe the symptoms are. Sometimes treatment is not needed. Treatment often includes: Medicines. Physical therapy or occupational therapy. This helps strengthen muscles that were weakened by the disease. You will need to see your health care provider while you are being treated. During follow-up visits, your health care provider may: Perform blood tests and bone density tests. Check your blood pressure and blood sugar. Check for side effects of any medicines you are taking. Vasculitis cannot always be cured. Sometimes symptoms go away but the disease does not (the disease goes into remission). If symptoms return, increased treatment may be needed. Follow these instructions at home: Take over-the-counter and prescription medicines only as told by your health care provider. Exercise as directed. Talk with your health care provider about what exercises are okay for you to do. Exercises that increase your heart rate (aerobic exercise), such as walking, are usually recommended. Aerobic exercise helps control your blood pressure and prevent bone loss. Follow a healthy diet. Make sure your diet includes fruits, vegetables, whole grains, and healthy sources of protein. Learn as much as you can about vasculitis, and consider joining a support group. Talk to other people who have your condition. This may help you cope with the illness. Talk with your health care provider if you feel stressed, anxious, or depressed. Keep all follow-up visits as told by your health care provider. This is important. Contact a health care provider if: Your symptoms return or you have new symptoms. Your fever, fatigue, headache, or weight loss gets worse. You have signs of infection, such as redness, swelling,   tenderness, warmth, or a new fever. Your pain does not go away, even  after you take pain medicine. Your nose bleeds. Get help right away if: Your vision gets worse. You have chest pain or stomach pain. You have trouble breathing. One side of your face or body suddenly becomes weak or numb. There is blood in your urine. Summary Vasculitis is inflammation of the blood vessels that may cause them to become thick, narrow, scarred, or weak. Enough blood may not be able to flow through them. This can cause damage throughout your body. The exact cause of this condition is not known. However, vasculitis can develop when the body's immune system attacks its own blood vessels. This attack may be caused by an infection, an immune system disease, an allergic reaction to a medicine, or a cancer that affects blood cells, such as leukemia or lymphoma. Vasculitis cannot always be cured. Sometimes symptoms go away but the disease does not (the disease goes into remission). If symptoms return, increased treatment may be needed. This information is not intended to replace advice given to you by your health care provider. Make sure you discuss any questions you have with your healthcare provider. Document Revised: 09/12/2017 Document Reviewed: 09/02/2017 Elsevier Patient Education  2022 [employer]  

## 2021-03-29 DIAGNOSIS — L218 Other seborrheic dermatitis: Secondary | ICD-10-CM | POA: Diagnosis not present

## 2021-03-29 DIAGNOSIS — L710 Perioral dermatitis: Secondary | ICD-10-CM | POA: Diagnosis not present

## 2021-04-21 ENCOUNTER — Other Ambulatory Visit: Payer: Self-pay | Admitting: Nurse Practitioner

## 2021-04-21 DIAGNOSIS — Z8709 Personal history of other diseases of the respiratory system: Secondary | ICD-10-CM

## 2021-05-04 NOTE — Progress Notes (Signed)
Office Visit Note  Patient: Joyce Zhang             Date of Birth: 1971/05/25           MRN: [id number]             PCP: Patton Salles, MD Referring: Charlesetta Ivory, NP Visit Date: 05/18/2021 Occupation: @GUAROCC @  Subjective:  Rash, vasculitis flare.   History of Present Illness: Deeya Richeson is a 50 y.o. female with a history of leukocytoclastic vasculitis and episcleritis.  She returns today after her last visit and May 2018.  At the time she was treated with methotrexate 6 tablets p.o. weekly.  Her vasculitis was under good control with methotrexate.  She moved to [place] in 2018.  She states she did not go to a rheumatologist and was off methotrexate this whole time.  She has been having episodes of rash on her extremities and her trunk.  She states the rash is related to drinking alcohol.  She has not had any flares of episcleritis.  Activities of Daily Living:  Patient reports morning stiffness for 0 minutes.   Patient Denies nocturnal pain.  Difficulty dressing/grooming: Denies Difficulty climbing stairs: Denies Difficulty getting out of chair: Denies Difficulty using hands for taps, buttons, cutlery, and/or writing: Denies  Review of Systems  Constitutional:  Negative for fatigue.  HENT:  Negative for mouth sores, mouth dryness and nose dryness.   Eyes:  Negative for pain, itching and dryness.  Respiratory:  Negative for shortness of breath and difficulty breathing.   Cardiovascular:  Negative for chest pain and palpitations.  Gastrointestinal:  Negative for blood in stool, constipation and diarrhea.  Endocrine: Negative for increased urination.  Genitourinary:  Negative for difficulty urinating.  Musculoskeletal:  Negative for joint pain, joint pain, joint swelling, myalgias, morning stiffness, muscle tenderness and myalgias.  Skin:  Positive for rash. Negative for color change, redness and sensitivity to sunlight.   Allergic/Immunologic: Negative for susceptible to infections.  Neurological:  Negative for dizziness, numbness, headaches, memory loss and weakness.  Hematological:  Negative for bruising/bleeding tendency and swollen glands.  Psychiatric/Behavioral:  Negative for depressed mood, confusion and sleep disturbance. The patient is not nervous/anxious.    PMFS History:  Patient Active Problem List   Diagnosis Date Noted   Leukocytoclastic vasculitis (HCC) 09/16/2016   Alcohol use 09/13/2016   Vasculitis on skin biopsy (HCC) 07/02/2016   Episcleritis of both eyes 07/02/2016   High risk medication use 07/02/2016   Asthma 07/02/2016   Allergic rhinitis 07/02/2016   Noncompliance 07/02/2016   History of DVT (deep vein thrombosis) 07/02/2016   OP (osteoporosis) 07/02/2016   Smoker 07/02/2016    Past Medical History:  Diagnosis Date   Bone spur of left foot    Dysmenorrhea    Episcleritis of both eyes    Fibroid    H/O vasculitis    in legs   Scleritis     Family History  Adopted: Yes  Problem Relation Age of Onset   Diabetes Mother    Asthma Mother    Breast cancer Mother    Diabetes Maternal Grandmother    Asthma Maternal Grandmother    Diabetes Maternal Grandfather    Past Surgical History:  Procedure Laterality Date   broken foot Right    fracture of index finger Left    Social History   Social History Narrative   Not on file   Immunization History  Administered Date(s) Administered  Moderna Sars-Covid-2 Vaccination 12/31/2019, 01/14/2020     Objective: Vital Signs: BP (!) 161/107 (BP Location: Right Arm, Patient Position: Sitting, Cuff Size: Large)   Pulse 67   Ht $R'5\' 7"'Ht$  (1.702 m)   Wt 278 lb (126.1 kg)   BMI 43.54 kg/m    Physical Exam Vitals and nursing note reviewed.  Constitutional:      Appearance: She is well-developed.  HENT:     Head: Normocephalic and atraumatic.  Eyes:     Conjunctiva/sclera: Conjunctivae normal.  Cardiovascular:     Rate  and Rhythm: Normal rate and regular rhythm.     Heart sounds: Normal heart sounds.  Pulmonary:     Effort: Pulmonary effort is normal.     Breath sounds: Normal breath sounds.  Abdominal:     General: Bowel sounds are normal.     Palpations: Abdomen is soft.  Musculoskeletal:     Cervical back: Normal range of motion.  Lymphadenopathy:     Cervical: No cervical adenopathy.  Skin:    General: Skin is warm and dry.     Capillary Refill: Capillary refill takes less than 2 seconds.     Comments: Erythematous macular lesions and purpuric lesions were noted on the lower extremities abdomen and her upper arms.  Neurological:     Mental Status: She is alert and oriented to person, place, and time.  Psychiatric:        Behavior: Behavior normal.     Musculoskeletal Exam: C-spine thoracic and lumbar spine with good range of motion.  Shoulder joints, elbow joints, wrist joints, MCPs PIPs and DIPs with good range of motion with no synovitis.  Hip joints, knee joints, ankles, MTPs and PIPs with good range of motion with no synovitis.  CDAI Exam: CDAI Score: -- Patient Global: --; Provider Global: -- Swollen: --; Tender: -- Joint Exam 05/18/2021   No joint exam has been documented for this visit   There is currently no information documented on the homunculus. Go to the Rheumatology activity and complete the homunculus joint exam.  Investigation: No additional findings.  Imaging: No results found.  Recent Labs: Lab Results  Component Value Date   WBC 3.4 12/20/2019   HGB 11.9 12/20/2019   PLT 196 12/20/2019   NA 143 01/03/2020   K 4.3 01/03/2020   CL 104 01/03/2020   CO2 23 01/03/2020   GLUCOSE 88 01/03/2020   BUN 10 01/03/2020   CREATININE 0.56 (L) 01/03/2020   BILITOT <0.2 12/20/2019   ALKPHOS 58 12/20/2019   AST 10 12/20/2019   ALT 10 12/20/2019   PROT 7.5 12/20/2019   ALBUMIN 4.2 12/20/2019   CALCIUM 9.1 01/03/2020   GFRAA 128 01/03/2020    Speciality Comments: No  specialty comments available.  Procedures:  No procedures performed Allergies: Patient has no known allergies.   Assessment / Plan:     Visit Diagnoses: Vasculitis on skin biopsy ([place]) - 07/04/16: ANCA-, ANA-, RF<14, anti-CCP<16, TSH 0.59, ESR 14, CK 200 -patient went into remission on methotrexate in the past.  She has been off methotrexate since 2018.  She states she has had intermittent rash since 2018.  The rash got worse in the last few months.  She has extensive rash, on her extremities and her trunk.  She has several new lesions.  I detailed discussion with the patient regarding vasculitis and the treatment.  She has done well on the methotrexate in the past.  She will go back on methotrexate.  Indications side  effects contraindications of methotrexate were discussed at length.  Handout was given and consent was taken.  I plan to start her on methotrexate after the labs are available.  I will also use prednisone as a bridging therapy.  The plan is to start her on prednisone 30 mg p.o. daily for 1 week and then taper by 5 mg every week until she reaches 7.5 mg.  Then we will taper by 2.5 mg every 2 weeks.  I may plan on faster taper depending on the response.  Side effects of prednisone were also discussed at length.  She had done well on methotrexate 6 tablets p.o. weekly in the past.  I will resume the same dose once the labs are available.  Plan: Sedimentation rate, Pan-ANCA  Drug Counseling TB Gold: Pending Hepatitis panel: Pending  Chest-xray: October 05, 2020  Contraception: Not indicated.  She is on Depo-Provera.  She has female sexual partners only  Alcohol use: She drinks socially.  Abstinence from alcohol was discussed.  Patient was counseled on the purpose, proper use, and adverse effects of methotrexate including nausea, infection, and signs and symptoms of pneumonitis.  Reviewed instructions with patient to take methotrexate weekly along with folic acid daily.  Discussed the  importance of frequent monitoring of kidney and liver function and blood counts, and provided patient with standing lab instructions.  Counseled patient to avoid NSAIDs and alcohol while on methotrexate.  Provided patient with educational materials on methotrexate and answered all questions.  Advised patient to get annual influenza vaccine and to get a pneumococcal vaccine if patient has not already had one.  Patient voiced understanding.  Patient consented to methotrexate use.  Will upload into chart.     Episcleritis of both eyes - Treated by Dr. Kathrin Penner at [facility] ophthalmology in the past.  She has not had any episodes since 2018.  I advised her to schedule a follow-up appointment for evaluation.  High risk medication use - Treated with methotrexate 6 tablets p.o. weekly in the past. - Plan: CBC with Differential/Platelet, COMPLETE METABOLIC PANEL WITH GFR, Hepatitis B surface antigen, Hepatitis B core antibody, IgM, Hepatitis C antibody, HIV Antibody (routine testing w rflx), QuantiFERON-TB Gold Plus, Serum protein electrophoresis with reflex, IgG, IgA, IgM.  She was advised to stop methotrexate in case she develops an infection and resume once the infection resolves.  Information regarding realization was also placed in the AVS.  Elevated CK -her CK was mildly elevated in the past.  We will check CK again today.  Plan: CK  History of DVT (deep vein thrombosis) - 2012 after cast on her right leg.  Seasonal allergic rhinitis due to other allergic trigger  Former smoker - She quit smoking in 2020.  Half a pack per day for 30 years.  Alcohol use - Social drinking.  Abstinence from alcohol was discussed.  Orders: Orders Placed This Encounter  Procedures   CBC with Differential/Platelet   COMPLETE METABOLIC PANEL WITH GFR   CK   Sedimentation rate   Hepatitis B surface antigen   Hepatitis B core antibody, IgM   Hepatitis C antibody   HIV Antibody (routine testing w rflx)    QuantiFERON-TB Gold Plus   Serum protein electrophoresis with reflex   IgG, IgA, IgM   Pan-ANCA    No orders of the defined types were placed in this encounter.    Follow-Up Instructions: Return for Leukocytoclastic vasculitis.   Bo Merino, MD  Note - This record has been created  using Editor, commissioning.  Chart creation errors have been sought, but may not always  have been located. Such creation errors do not reflect on  the standard of medical care.

## 2021-05-07 ENCOUNTER — Ambulatory Visit (INDEPENDENT_AMBULATORY_CARE_PROVIDER_SITE_OTHER): Payer: BC Managed Care – PPO | Admitting: Gynecology

## 2021-05-07 ENCOUNTER — Other Ambulatory Visit: Payer: Self-pay

## 2021-05-07 DIAGNOSIS — Z3042 Encounter for surveillance of injectable contraceptive: Secondary | ICD-10-CM

## 2021-05-07 MED ORDER — MEDROXYPROGESTERONE ACETATE 150 MG/ML IM SUSP
150.0000 mg | Freq: Once | INTRAMUSCULAR | Status: AC
  Administered 2021-05-07: 150 mg via INTRAMUSCULAR

## 2021-05-18 ENCOUNTER — Ambulatory Visit: Payer: BC Managed Care – PPO | Admitting: Rheumatology

## 2021-05-18 ENCOUNTER — Encounter: Payer: Self-pay | Admitting: Rheumatology

## 2021-05-18 ENCOUNTER — Other Ambulatory Visit: Payer: Self-pay

## 2021-05-18 VITALS — BP 161/107 | HR 67 | Ht 67.0 in | Wt 278.0 lb

## 2021-05-18 DIAGNOSIS — I776 Arteritis, unspecified: Secondary | ICD-10-CM

## 2021-05-18 DIAGNOSIS — Z7289 Other problems related to lifestyle: Secondary | ICD-10-CM

## 2021-05-18 DIAGNOSIS — H15103 Unspecified episcleritis, bilateral: Secondary | ICD-10-CM

## 2021-05-18 DIAGNOSIS — Z79899 Other long term (current) drug therapy: Secondary | ICD-10-CM

## 2021-05-18 DIAGNOSIS — Z86718 Personal history of other venous thrombosis and embolism: Secondary | ICD-10-CM

## 2021-05-18 DIAGNOSIS — Z111 Encounter for screening for respiratory tuberculosis: Secondary | ICD-10-CM | POA: Diagnosis not present

## 2021-05-18 DIAGNOSIS — R748 Abnormal levels of other serum enzymes: Secondary | ICD-10-CM | POA: Diagnosis not present

## 2021-05-18 DIAGNOSIS — L959 Vasculitis limited to the skin, unspecified: Secondary | ICD-10-CM

## 2021-05-18 DIAGNOSIS — F1090 Alcohol use, unspecified, uncomplicated: Secondary | ICD-10-CM

## 2021-05-18 DIAGNOSIS — J3089 Other allergic rhinitis: Secondary | ICD-10-CM

## 2021-05-18 DIAGNOSIS — Z789 Other specified health status: Secondary | ICD-10-CM

## 2021-05-18 DIAGNOSIS — Z87891 Personal history of nicotine dependence: Secondary | ICD-10-CM

## 2021-05-18 MED ORDER — PREDNISONE 5 MG PO TABS: ORAL_TABLET | ORAL | 0 refills | Status: DC

## 2021-05-18 NOTE — Telephone Encounter (Signed)
Pending lab results, patient will be starting methotrexate and folic acid, per Dr. Estanislado Pandy. Thanks!  Consent obtained and sent to the scan center.

## 2021-05-18 NOTE — Patient Instructions (Addendum)
Standing Labs We placed an order today for your standing lab work.   Please have your standing labs drawn in 2 weeks and then every 3 months.   If possible, please have your labs drawn 2 weeks prior to your appointment so that the provider can discuss your results at your appointment.  Please note that you may see your imaging and lab results in [facility] before we have reviewed them. We may be awaiting multiple results to interpret others before contacting you. Please allow our office up to 72 hours to thoroughly review all of the results before contacting the office for clarification of your results.  We have open lab daily: Monday through Thursday from 1:30-4:30 PM and Friday from 1:30-4:00 PM at the office of Dr. Bo Merino, [facility].   Please be advised, all patients with office appointments requiring lab work will take precedent over walk-in lab work.  If possible, please come for your lab work on Monday and Friday afternoons, as you may experience shorter wait times. The office is located at [address], [place], [place], [place] [address] No appointment is necessary.   Labs are drawn by [facility]. Please bring your co-pay at the time of your lab draw.  You may receive a bill from [facility] for your lab work.  If you wish to have your labs drawn at another location, please call the office 24 hours in advance to send orders.  If you have any questions regarding directions or hours of operation,  please call [phone].   As a reminder, please drink plenty of water prior to coming for your lab work. Thanks!     Methotrexate Tablets What is this medication? METHOTREXATE (METH oh TREX ate) treats inflammatory conditions such as arthritis and psoriasis. It works by decreasing inflammation, which can reduce pain and prevent long-term injury to the joints and skin. It may also be used to treat some types of cancer. It works by slowing down the growth of cancer  cells. This medicine may be used for other purposes; ask your health care provider or pharmacist if you have questions. COMMON BRAND NAME(S): Rheumatrex, Trexall What should I tell my care team before I take this medication? They need to know if you have any of these conditions: Fluid in the stomach area or lungs If you often drink alcohol Infection or immune system problems Kidney disease or on hemodialysis Liver disease Low blood counts, like low white cell, platelet, or red cell counts Lung disease Radiation therapy Stomach ulcers Ulcerative colitis An unusual or allergic reaction to methotrexate, other medications, foods, dyes, or preservatives Pregnant or trying to get pregnant Breast-feeding How should I use this medication? Take this medication by mouth with a glass of water. Follow the directions on the prescription label. Take your medication at regular intervals. Do not take it more often than directed. Do not stop taking except on your care team's advice. Make sure you know why you are taking this medication and how often you should take it. If this medication is used for a condition that is not cancer, like arthritis or psoriasis, it should be taken weekly, NOT daily. Taking this medication more often than directed can cause serious side effects, even death. Talk to your care team about safe handling and disposal of this medication. You may need to take special precautions. Talk to your care team about the use of this medication in children. While this medication may be prescribed for selected conditions, precautions do  apply. Overdosage: If you think you have taken too much of this medicine contact a poison control center or emergency room at once. NOTE: This medicine is only for you. Do not share this medicine with others. What if I miss a dose? If you miss a dose, talk with your care team. Do not take double or extra doses. What may interact with this medication? Do not  take this medication with any of the following: Acitretin This medication may also interact with the following: Aspirin and aspirin-like medications including salicylates Azathioprine Certain antibiotics like penicillins, tetracycline, and chloramphenicol Certain medications that treat or prevent blood clots like warfarin, apixaban, dabigatran, and rivaroxaban Certain medications for stomach problems like esomeprazole, omeprazole, pantoprazole Cyclosporine Dapsone Diuretics Gold Hydroxychloroquine Live virus vaccines Medications for infection like acyclovir, adefovir, amphotericin B, bacitracin, cidofovir, foscarnet, ganciclovir, gentamicin, pentamidine, vancomycin Mercaptopurine NSAIDs, medications for pain and inflammation, like ibuprofen or naproxen Other cytotoxic agents Pamidronate Pemetrexed Penicillamine Phenylbutazone Phenytoin Probenecid Pyrimethamine Retinoids such as isotretinoin and tretinoin Steroid medications like prednisone or cortisone Sulfonamides like sulfasalazine and trimethoprim/sulfamethoxazole Theophylline Zoledronic acid This list may not describe all possible interactions. Give your health care provider a list of all the medicines, herbs, non-prescription drugs, or dietary supplements you use. Also tell them if you smoke, drink alcohol, or use illegal drugs. Some items may interact with your medicine. What should I watch for while using this medication? Avoid alcoholic drinks. This medication can make you more sensitive to the sun. Keep out of the sun. If you cannot avoid being in the sun, wear protective clothing and use sunscreen. Do not use sun lamps or tanning beds/booths. You may need blood work done while you are taking this medication. Call your care team for advice if you get a fever, chills or sore throat, or other symptoms of a cold or flu. Do not treat yourself. This medication decreases your body's ability to fight infections. Try to avoid  being around people who are sick. This medication may increase your risk to bruise or bleed. Call your care team if you notice any unusual bleeding. Be careful brushing or flossing your teeth or using a toothpick because you may get an infection or bleed more easily. If you have any dental work done, tell your dentist you are receiving this medication. Check with your care team if you get an attack of severe diarrhea, nausea and vomiting, or if you sweat a lot. The loss of too much body fluid can make it dangerous for you to take this medication. Talk to your care team about your risk of cancer. You may be more at risk for certain types of cancers if you take this medication. Do not become pregnant while taking this medication or for 6 months after stopping it. Women should inform their care team if they wish to become pregnant or think they might be pregnant. Men should not father a child while taking this medication and for 3 months after stopping it. There is potential for serious harm to an unborn child. Talk to your care team for more information. Do not breast-feed an infant while taking this medication or for 1 week after stopping it. This medication may make it more difficult to get pregnant or father a child. Talk to your care team if you are concerned about your fertility. What side effects may I notice from receiving this medication? Side effects that you should report to your care team as soon as possible: Allergic reactions-skin rash, itching, hives,  swelling of the face, lips, tongue, or throat Blood clot-pain, swelling, or warmth in the leg, shortness of breath, chest pain Dry cough, shortness of breath or trouble breathing Infection-fever, chills, cough, sore throat, wounds that don't heal, pain or trouble when passing urine, general feeling of discomfort or being unwell Kidney injury-decrease in the amount of urine, swelling of the ankles, hands, or feet Liver injury-right upper belly  pain, loss of appetite, nausea, light-colored stool, dark yellow or brown urine, yellowing of the skin or eyes, unusual weakness or fatigue Low red blood cell count-unusual weakness or fatigue, dizziness, headache, trouble breathing Redness, blistering, peeling, or loosening of the skin, including inside the mouth Seizures Unusual bruising or bleeding Side effects that usually do not require medical attention (report to your care team if they continue or are bothersome): Diarrhea Dizziness Hair loss Nausea Pain, redness, or swelling with sores inside the mouth or throat Vomiting This list may not describe all possible side effects. Call your doctor for medical advice about side effects. You may report side effects to FDA at 1-800-FDA-1088. Where should I keep my medication? Keep out of the reach of children and pets. Store at room temperature between 20 and 25 degrees C (68 and 77 degrees F). Protect from light. Get rid of any unused medication after the expiration date. Talk to your care team about how to dispose of unused medication. Special directions may apply. NOTE: This sheet is a summary. It may not cover all possible information. If you have questions about this medicine, talk to your doctor, pharmacist, or health care provider.  2022 Elsevier/Gold Standard (2020-09-18 15:55:45)  Vaccines You are taking a medication(s) that can suppress your immune system.  The following immunizations are recommended: Flu annually Covid-19  Td/Tdap (tetanus, diphtheria, pertussis) every 10 years Pneumonia (Prevnar 15 then Pneumovax 23 at least 1 year apart.  Alternatively, can take Prevnar 20 without needing additional dose) Shingrix: 2 doses from 4 weeks to 6 months apart  Please check with your PCP to make sure you are up to date.   If you test POSITIVE for COVID19 and have MILD to MODERATE symptoms: First, call your PCP if you would like to receive COVID19 treatment AND Hold your  medications during the infection and for at least 1 week after your symptoms have resolved: Injectable medication (Benlysta, Cimzia, Cosentyx, Enbrel, Humira, Orencia, Remicade, Simponi, Stelara, Taltz, Tremfya) Methotrexate Leflunomide (Arava) Azathioprine Mycophenolate (Cellcept) Roma Kayser, or Rinvoq Otezla If you take Actemra or Kevzara, you DO NOT need to hold these for COVID19 infection.  If you test POSITIVE for COVID19 and have NO symptoms: First, call your PCP if you would like to receive COVID19 treatment AND Hold your medications for at least 10 days after the day that you tested positive Injectable medication (Benlysta, Cimzia, Cosentyx, Enbrel, Humira, Orencia, Remicade, Simponi, Stelara, Taltz, Tremfya) Methotrexate Leflunomide (Arava) Azathioprine Mycophenolate (Cellcept) Roma Kayser, or Rinvoq Otezla If you take Actemra or Kevzara, you DO NOT need to hold these for COVID19 infection.   If you have signs or symptoms of an infection or start antibiotics: First, call your PCP for workup of your infection. Hold your medication through the infection, until you complete your antibiotics, and until symptoms resolve if you take the following: Injectable medication (Actemra, Benlysta, Cimzia, Cosentyx, Enbrel, Humira, Kevzara, Orencia, Remicade, Simponi, Stelara, Taltz, Tremfya) Methotrexate Leflunomide (Arava) Mycophenolate (Cellcept) Roma Kayser, or Rinvoq   Heart Disease Prevention   Your inflammatory disease increases your risk of heart  disease which includes heart attack, stroke, atrial fibrillation (irregular heartbeats), high blood pressure, heart failure and atherosclerosis (plaque in the arteries).  It is important to reduce your risk by:   Keep blood pressure, cholesterol, and blood sugar at healthy levels   Smoking Cessation   Maintain a healthy weight  BMI 20-25   Eat a healthy diet  Plenty of fresh fruit, vegetables, and whole grains   Limit saturated fats, foods high in sodium, and added sugars  DASH and Mediterranean diet   Increase physical activity  Recommend moderate physically activity for 150 minutes per week/ 30 minutes a day for five days a week These can be broken up into three separate ten-minute sessions during the day.   Reduce Stress  Meditation, slow breathing exercises, yoga, coloring books  Dental visits twice a year

## 2021-05-24 DIAGNOSIS — J209 Acute bronchitis, unspecified: Secondary | ICD-10-CM | POA: Diagnosis not present

## 2021-05-24 LAB — COMPLETE METABOLIC PANEL WITH GFR
AG Ratio: 1.3 (calc) (ref 1.0–2.5)
ALT: 10 U/L (ref 6–29)
AST: 12 U/L (ref 10–35)
Albumin: 4.1 g/dL (ref 3.6–5.1)
Alkaline phosphatase (APISO): 52 U/L (ref 31–125)
BUN: 12 mg/dL (ref 7–25)
CO2: 23 mmol/L (ref 20–32)
Calcium: 9.1 mg/dL (ref 8.6–10.2)
Chloride: 102 mmol/L (ref 98–110)
Creat: 0.66 mg/dL (ref 0.50–0.99)
Globulin: 3.1 g/dL (calc) (ref 1.9–3.7)
Glucose, Bld: 95 mg/dL (ref 65–99)
Potassium: 4.3 mmol/L (ref 3.5–5.3)
Sodium: 137 mmol/L (ref 135–146)
Total Bilirubin: 0.3 mg/dL (ref 0.2–1.2)
Total Protein: 7.2 g/dL (ref 6.1–8.1)
eGFR: 108 mL/min/{1.73_m2} (ref >=60)

## 2021-05-24 LAB — QUANTIFERON-TB GOLD PLUS
Mitogen-NIL: >10 IU/mL
NIL: 0.05 IU/mL
QuantiFERON-TB Gold Plus: NEGATIVE
TB1-NIL: 0.01 IU/mL
TB2-NIL: <0 IU/mL

## 2021-05-24 LAB — PAN-ANCA
ANCA Screen: NEGATIVE
Myeloperoxidase Abs: <1 AI
Serine Protease 3: <1 AI

## 2021-05-24 LAB — CBC WITH DIFFERENTIAL/PLATELET
Absolute Monocytes: 152 cells/uL — ABNORMAL LOW (ref 200–950)
Basophils Absolute: 19 cells/uL (ref 0–200)
Basophils Relative: 0.5 %
Eosinophils Absolute: 103 cells/uL (ref 15–500)
Eosinophils Relative: 2.7 %
HCT: 37.2 % (ref 35.0–45.0)
Hemoglobin: 12 g/dL (ref 11.7–15.5)
Lymphs Abs: 1182 cells/uL (ref 850–3900)
MCH: 28 pg (ref 27.0–33.0)
MCHC: 32.3 g/dL (ref 32.0–36.0)
MCV: 86.9 fL (ref 80.0–100.0)
MPV: 10.6 fL (ref 7.5–12.5)
Monocytes Relative: 4 %
Neutro Abs: 2345 cells/uL (ref 1500–7800)
Neutrophils Relative %: 61.7 %
Platelets: 210 10*3/uL (ref 140–400)
RBC: 4.28 10*6/uL (ref 3.80–5.10)
RDW: 13.6 % (ref 11.0–15.0)
Total Lymphocyte: 31.1 %
WBC: 3.8 10*3/uL (ref 3.8–10.8)

## 2021-05-24 LAB — SEDIMENTATION RATE: Sed Rate: 28 mm/h — ABNORMAL HIGH (ref 0–20)

## 2021-05-24 LAB — IGG, IGA, IGM
IgG (Immunoglobin G), Serum: 1205 mg/dL (ref 600–1640)
IgM, Serum: 124 mg/dL (ref 50–300)
Immunoglobulin A: 228 mg/dL (ref 47–310)

## 2021-05-24 LAB — HEPATITIS B SURFACE ANTIGEN: Hepatitis B Surface Ag: NONREACTIVE

## 2021-05-24 LAB — HEPATITIS C ANTIBODY
Hepatitis C Ab: NONREACTIVE
SIGNAL TO CUT-OFF: 0.01 (ref <1.00)

## 2021-05-24 LAB — PROTEIN ELECTROPHORESIS, SERUM, WITH REFLEX
Albumin ELP: 3.8 g/dL (ref 3.8–4.8)
Alpha 1: 0.5 g/dL — ABNORMAL HIGH (ref 0.2–0.3)
Alpha 2: 1 g/dL — ABNORMAL HIGH (ref 0.5–0.9)
Beta 2: 0.5 g/dL (ref 0.2–0.5)
Beta Globulin: 0.5 g/dL (ref 0.4–0.6)
Gamma Globulin: 1.1 g/dL (ref 0.8–1.7)
Total Protein: 7.3 g/dL (ref 6.1–8.1)

## 2021-05-24 LAB — HIV ANTIBODY (ROUTINE TESTING W REFLEX): HIV 1&2 Ab, 4th Generation: NONREACTIVE

## 2021-05-24 LAB — HEPATITIS B CORE ANTIBODY, IGM: Hep B C IgM: NONREACTIVE

## 2021-05-24 LAB — CK: Total CK: 38 U/L (ref 29–143)

## 2021-05-24 MED ORDER — METHOTREXATE 2.5 MG PO TABS: 15.0000 mg | ORAL_TABLET | ORAL | 0 refills | Status: DC

## 2021-05-24 MED ORDER — FOLIC ACID 1 MG PO TABS: 1.0000 mg | ORAL_TABLET | Freq: Every day | ORAL | 3 refills | Status: DC

## 2021-05-24 NOTE — Telephone Encounter (Signed)
Per lab note 05/24/2021: Please send the prescription for methotrexate 2.5 mg tablet, 6 tablets p.o. weekly along with folic acid 1 mg p.o. daily.  Patient should get labs in 2 weeks, and then every 3 months.

## 2021-05-24 NOTE — Progress Notes (Signed)
CBC and CMP normal.  CK is normal, sed rate is mildly elevated.  Hepatitis B, hepatitis C, HIV, immunoglobulins, SPEP are normal.  All the tests for vasculitis are negative.  Please send the prescription for methotrexate 2.5 mg tablet, 6 tablets p.o. weekly along with folic acid 1 mg p.o. daily.  Patient should get labs in 2 weeks, and then every 3 months.

## 2021-06-01 NOTE — Progress Notes (Deleted)
Office Visit Note  Patient: Joyce Zhang             Date of Birth: [birth year]           MRN: [id number]             PCP: Nunzio Cobbs, MD Referring: Aundria Rud* Visit Date: 06/11/2021 Occupation: @GUAROCC @  Subjective:  No chief complaint on file.   History of Present Illness: Joyce Zhang is a 50 y.o. female ***   Activities of Daily Living:  Patient reports morning stiffness for *** {minute/hour:19697}.   Patient {ACTIONS;DENIES/REPORTS:21021675::"Denies"} nocturnal pain.  Difficulty dressing/grooming: {ACTIONS;DENIES/REPORTS:21021675::"Denies"} Difficulty climbing stairs: {ACTIONS;DENIES/REPORTS:21021675::"Denies"} Difficulty getting out of chair: {ACTIONS;DENIES/REPORTS:21021675::"Denies"} Difficulty using hands for taps, buttons, cutlery, and/or writing: {ACTIONS;DENIES/REPORTS:21021675::"Denies"}  No Rheumatology ROS completed.   PMFS History:  Patient Active Problem List   Diagnosis Date Noted   Leukocytoclastic vasculitis ([place]) 09/16/2016   Alcohol use 09/13/2016   Vasculitis on skin biopsy ([place]) 07/02/2016   Episcleritis of both eyes 07/02/2016   High risk medication use 07/02/2016   Asthma 07/02/2016   Allergic rhinitis 07/02/2016   Noncompliance 07/02/2016   History of DVT (deep vein thrombosis) 07/02/2016   OP (osteoporosis) 07/02/2016   Smoker 07/02/2016    Past Medical History:  Diagnosis Date   Bone spur of left foot    Dysmenorrhea    Episcleritis of both eyes    Fibroid    H/O vasculitis    in legs   Scleritis     Family History  Adopted: Yes  Problem Relation Age of Onset   Diabetes Mother    Asthma Mother    Breast cancer Mother    Diabetes Maternal Grandmother    Asthma Maternal Grandmother    Diabetes Maternal Grandfather    Past Surgical History:  Procedure Laterality Date   broken foot Right    fracture of index finger Left    Social History   Social History Narrative   Not on  file   Immunization History  Administered Date(s) Administered   Moderna Sars-Covid-2 Vaccination 12/31/2019, 01/14/2020     Objective: Vital Signs: There were no vitals taken for this visit.   Physical Exam   Musculoskeletal Exam: ***  CDAI Exam: CDAI Score: -- Patient Global: --; Provider Global: -- Swollen: --; Tender: -- Joint Exam 06/11/2021   No joint exam has been documented for this visit   There is currently no information documented on the homunculus. Go to the Rheumatology activity and complete the homunculus joint exam.  Investigation: No additional findings.  Imaging: No results found.  Recent Labs: Lab Results  Component Value Date   WBC 3.8 05/18/2021   HGB 12.0 05/18/2021   PLT 210 05/18/2021   NA 137 05/18/2021   K 4.3 05/18/2021   CL 102 05/18/2021   CO2 23 05/18/2021   GLUCOSE 95 05/18/2021   BUN 12 05/18/2021   CREATININE 0.66 05/18/2021   BILITOT 0.3 05/18/2021   ALKPHOS 58 12/20/2019   AST 12 05/18/2021   ALT 10 05/18/2021   PROT 7.2 05/18/2021   PROT 7.3 05/18/2021   ALBUMIN 4.2 12/20/2019   CALCIUM 9.1 05/18/2021   GFRAA 128 01/03/2020   QFTBGOLDPLUS NEGATIVE 05/18/2021   May 18, 2021 SPEP normal, immunoglobulins normal, TB Gold negative, hepatitis B negative, hepatitis C negative, HIV negative, ANCA negative, MPO negative, serine proteinase 3 negative, CK 38, ESR 28,   Speciality Comments: No specialty comments available.  Procedures:  No procedures performed Allergies: Patient has no known allergies.   Assessment / Plan:     Visit Diagnoses: No diagnosis found.  Orders: No orders of the defined types were placed in this encounter.  No orders of the defined types were placed in this encounter.   Face-to-face time spent with patient was *** minutes. Greater than 50% of time was spent in counseling and coordination of care.  Follow-Up Instructions: No follow-ups on file.   Bo Merino, MD  Note - This  record has been created using Editor, commissioning.  Chart creation errors have been sought, but may not always  have been located. Such creation errors do not reflect on  the standard of medical care.

## 2021-06-11 ENCOUNTER — Ambulatory Visit: Payer: BC Managed Care – PPO | Admitting: Rheumatology

## 2021-06-11 DIAGNOSIS — Z789 Other specified health status: Secondary | ICD-10-CM

## 2021-06-11 DIAGNOSIS — Z79899 Other long term (current) drug therapy: Secondary | ICD-10-CM

## 2021-06-11 DIAGNOSIS — R748 Abnormal levels of other serum enzymes: Secondary | ICD-10-CM

## 2021-06-11 DIAGNOSIS — Z87891 Personal history of nicotine dependence: Secondary | ICD-10-CM

## 2021-06-11 DIAGNOSIS — J3089 Other allergic rhinitis: Secondary | ICD-10-CM

## 2021-06-11 DIAGNOSIS — Z86718 Personal history of other venous thrombosis and embolism: Secondary | ICD-10-CM

## 2021-06-11 DIAGNOSIS — H15103 Unspecified episcleritis, bilateral: Secondary | ICD-10-CM

## 2021-06-11 DIAGNOSIS — I776 Arteritis, unspecified: Secondary | ICD-10-CM

## 2021-06-28 ENCOUNTER — Ambulatory Visit: Payer: BC Managed Care – PPO | Admitting: Obstetrics and Gynecology

## 2021-07-23 ENCOUNTER — Ambulatory Visit (INDEPENDENT_AMBULATORY_CARE_PROVIDER_SITE_OTHER): Payer: BC Managed Care – PPO

## 2021-07-23 ENCOUNTER — Other Ambulatory Visit: Payer: Self-pay

## 2021-07-23 VITALS — BP 130/84

## 2021-07-23 DIAGNOSIS — Z3042 Encounter for surveillance of injectable contraceptive: Secondary | ICD-10-CM

## 2021-07-23 MED ORDER — MEDROXYPROGESTERONE ACETATE 150 MG/ML IM SUSP
150.0000 mg | Freq: Once | INTRAMUSCULAR | Status: AC
  Administered 2021-07-23: 16:00:00 150 mg via INTRAMUSCULAR

## 2021-07-23 NOTE — Progress Notes (Signed)
Patient is here for Depo Provera Injection Patient is within Depo Provera Calender Limits Yes Next Depo Due between: 10/08/21 - 10/22/21 Last AEX: 12-20-19 AEX Scheduled: 08-01-21--Pt. Knows she has to keep this appointment for further injections.  Patient is aware when next depo is due  Pt tolerated Injection well.  Routed to provider for review, encounter closed.

## 2021-07-31 ENCOUNTER — Encounter: Payer: BC Managed Care – PPO | Admitting: Nurse Practitioner

## 2021-08-01 ENCOUNTER — Ambulatory Visit: Payer: BC Managed Care – PPO | Admitting: Obstetrics and Gynecology

## 2021-08-10 ENCOUNTER — Other Ambulatory Visit: Payer: Self-pay

## 2021-08-10 DIAGNOSIS — Z79899 Other long term (current) drug therapy: Secondary | ICD-10-CM | POA: Diagnosis not present

## 2021-08-11 LAB — COMPLETE METABOLIC PANEL WITH GFR
AG Ratio: 1.4 (calc) (ref 1.0–2.5)
ALT: 15 U/L (ref 6–29)
AST: 12 U/L (ref 10–35)
Albumin: 4.4 g/dL (ref 3.6–5.1)
Alkaline phosphatase (APISO): 46 U/L (ref 31–125)
BUN: 10 mg/dL (ref 7–25)
CO2: 27 mmol/L (ref 20–32)
Calcium: 9.3 mg/dL (ref 8.6–10.2)
Chloride: 104 mmol/L (ref 98–110)
Creat: 0.78 mg/dL (ref 0.50–0.99)
Globulin: 3.1 g/dL (calc) (ref 1.9–3.7)
Glucose, Bld: 103 mg/dL — ABNORMAL HIGH (ref 65–99)
Potassium: 4.2 mmol/L (ref 3.5–5.3)
Sodium: 140 mmol/L (ref 135–146)
Total Bilirubin: 0.3 mg/dL (ref 0.2–1.2)
Total Protein: 7.5 g/dL (ref 6.1–8.1)
eGFR: 93 mL/min/{1.73_m2} (ref >=60)

## 2021-08-11 LAB — CBC WITH DIFFERENTIAL/PLATELET
Absolute Monocytes: 209 cells/uL (ref 200–950)
Basophils Absolute: 21 cells/uL (ref 0–200)
Basophils Relative: 0.5 %
Eosinophils Absolute: 279 cells/uL (ref 15–500)
Eosinophils Relative: 6.8 %
HCT: 39.4 % (ref 35.0–45.0)
Hemoglobin: 12.5 g/dL (ref 11.7–15.5)
Lymphs Abs: 1829 cells/uL (ref 850–3900)
MCH: 27.4 pg (ref 27.0–33.0)
MCHC: 31.7 g/dL — ABNORMAL LOW (ref 32.0–36.0)
MCV: 86.4 fL (ref 80.0–100.0)
MPV: 11.1 fL (ref 7.5–12.5)
Monocytes Relative: 5.1 %
Neutro Abs: 1763 cells/uL (ref 1500–7800)
Neutrophils Relative %: 43 %
Platelets: 205 10*3/uL (ref 140–400)
RBC: 4.56 10*6/uL (ref 3.80–5.10)
RDW: 14.5 % (ref 11.0–15.0)
Total Lymphocyte: 44.6 %
WBC: 4.1 10*3/uL (ref 3.8–10.8)

## 2021-08-13 NOTE — Progress Notes (Signed)
CBC and CMP WNL

## 2021-08-14 ENCOUNTER — Other Ambulatory Visit: Payer: Self-pay | Admitting: *Deleted

## 2021-08-14 ENCOUNTER — Telehealth: Payer: Self-pay

## 2021-08-14 MED ORDER — METHOTREXATE 2.5 MG PO TABS: 15.0000 mg | ORAL_TABLET | ORAL | 0 refills | Status: DC

## 2021-08-14 NOTE — Telephone Encounter (Signed)
Patient contacted the office requesting a refill on MTX  Next Visit: 10/19/2021  Last Visit: 05/18/2021  Last Fill: 05/24/2021  DX: Vasculitis on skin biopsy   Current Dose per office note 05/18/2021: methotrexate 6 tablets p.o. weekly   Labs: 08/10/2021 CBC and CMP WNL  Okay to refill MTX?

## 2021-08-14 NOTE — Telephone Encounter (Signed)
Patient called requesting prescription refill of Methotrexate to be sent to [facility] at [address]. 

## 2021-08-14 NOTE — Telephone Encounter (Signed)
Please see previous phone note.  

## 2021-08-27 DIAGNOSIS — Z20822 Contact with and (suspected) exposure to covid-19: Secondary | ICD-10-CM | POA: Diagnosis not present

## 2021-08-27 DIAGNOSIS — R0981 Nasal congestion: Secondary | ICD-10-CM | POA: Diagnosis not present

## 2021-08-27 DIAGNOSIS — R051 Acute cough: Secondary | ICD-10-CM | POA: Diagnosis not present

## 2021-09-29 DIAGNOSIS — B9689 Other specified bacterial agents as the cause of diseases classified elsewhere: Secondary | ICD-10-CM | POA: Diagnosis not present

## 2021-09-29 DIAGNOSIS — J208 Acute bronchitis due to other specified organisms: Secondary | ICD-10-CM | POA: Diagnosis not present

## 2021-10-08 NOTE — Progress Notes (Signed)
Office Visit Note  Patient: Joyce Zhang             Date of Birth: 1971-06-13           MRN: [id number]             PCP: Nunzio Cobbs, MD Referring: Aundria Rud* Visit Date: 10/22/2021 Occupation: _0 @  Subjective:  Medication monitoring   History of Present Illness: Joyce Zhang is a 51 y.o. female with history of vasculitis and episcleritis.  Patient is taking methotrexate 6 tablets by mouth once weekly and folic acid 1 mg daily.  She restarted methotrexate in September 2022.  She has been tolerating methotrexate without any side effects and has not missed any doses recently.  She denies any recent infections.  She has not had any signs or symptoms of a vasculitis flare since her last office visit.  She denies any rashes at this time.  She has not had any episcleritis flares.  She denies any joint pain or joint swelling.  She has not had any muscle weakness or muscle tenderness.  She denies any shortness of breath or pleuritic chest pain.    Activities of Daily Living:  Patient reports morning stiffness for 0 minutes.   Patient Denies nocturnal pain.  Difficulty dressing/grooming: Denies Difficulty climbing stairs: Denies Difficulty getting out of chair: Denies Difficulty using hands for taps, buttons, cutlery, and/or writing: Denies  Review of Systems  Constitutional:  Negative for fatigue.  HENT:  Negative for mouth sores, mouth dryness and nose dryness.   Eyes:  Negative for pain, itching and dryness.  Respiratory:  Negative for shortness of breath and difficulty breathing.   Cardiovascular:  Negative for chest pain and palpitations.  Gastrointestinal:  Negative for blood in stool, constipation and diarrhea.  Endocrine: Negative for increased urination.  Genitourinary:  Negative for difficulty urinating.  Musculoskeletal:  Negative for joint pain, joint pain, joint swelling, myalgias, morning stiffness, muscle tenderness and  myalgias.  Skin:  Negative for color change, rash and redness.  Allergic/Immunologic: Negative for susceptible to infections.  Neurological:  Negative for dizziness, numbness, headaches, memory loss and weakness.  Hematological:  Negative for bruising/bleeding tendency.  Psychiatric/Behavioral:  Negative for confusion.    PMFS History:  Patient Active Problem List   Diagnosis Date Noted   Leukocytoclastic vasculitis ([place]) 09/16/2016   Alcohol use 09/13/2016   Vasculitis on skin biopsy ([place]) 07/02/2016   Episcleritis of both eyes 07/02/2016   High risk medication use 07/02/2016   Asthma 07/02/2016   Allergic rhinitis 07/02/2016   Noncompliance 07/02/2016   History of DVT (deep vein thrombosis) 07/02/2016   OP (osteoporosis) 07/02/2016   Smoker 07/02/2016    Past Medical History:  Diagnosis Date   Bone spur of left foot    Dysmenorrhea    Episcleritis of both eyes    Fibroid    H/O vasculitis    in legs   Scleritis     Family History  Adopted: Yes  Problem Relation Age of Onset   Diabetes Mother    Asthma Mother    Breast cancer Mother    Diabetes Maternal Grandmother    Asthma Maternal Grandmother    Diabetes Maternal Grandfather    Past Surgical History:  Procedure Laterality Date   broken foot Right    fracture of index finger Left    Social History   Social History Narrative   Not on file   Immunization History  Administered Date(s) Administered   Moderna Sars-Covid-2 Vaccination 12/31/2019, 01/14/2020     Objective: Vital Signs: BP (!) 155/97 (BP Location: Right Arm, Patient Position: Sitting, Cuff Size: Large)    Pulse 93    Ht _0  (1.727 m)    Wt 269 lb 3.2 oz (122.1 kg)    BMI 40.93 kg/m    Physical Exam Vitals and nursing note reviewed.  Constitutional:      Appearance: She is well-developed.  HENT:     Head: Normocephalic and atraumatic.  Eyes:     Conjunctiva/sclera: Conjunctivae normal.  Cardiovascular:     Rate and Rhythm: Normal rate  and regular rhythm.     Heart sounds: Normal heart sounds.  Pulmonary:     Effort: Pulmonary effort is normal.     Breath sounds: Normal breath sounds.  Abdominal:     General: Bowel sounds are normal.     Palpations: Abdomen is soft.  Musculoskeletal:     Cervical back: Normal range of motion.  Lymphadenopathy:     Cervical: No cervical adenopathy.  Skin:    General: Skin is warm and dry.     Capillary Refill: Capillary refill takes less than 2 seconds.  Neurological:     Mental Status: She is alert and oriented to person, place, and time.  Psychiatric:        Behavior: Behavior normal.     Musculoskeletal Exam: C-spine, thoracic spine, lumbar spine have good range of motion with no discomfort.  No midline spinal tenderness or SI joint tenderness.  Shoulder joints, elbow joints, wrist joints, MCPs, PIPs, DIPs have good range of motion with no synovitis.  Complete fist formation bilaterally.  Hip joints have good range of motion with no groin pain.  Knee joints have good range of motion with no warmth or effusion.  Ankle joints have good range of motion with no tenderness or joint swelling.   CDAI Exam: CDAI Score: -- Patient Global: --; Provider Global: -- Swollen: --; Tender: -- Joint Exam 10/22/2021   No joint exam has been documented for this visit   There is currently no information documented on the homunculus. Go to the Rheumatology activity and complete the homunculus joint exam.  Investigation: No additional findings.  Imaging: No results found.  Recent Labs: Lab Results  Component Value Date   WBC 4.1 08/10/2021   HGB 12.5 08/10/2021   PLT 205 08/10/2021   NA 140 08/10/2021   K 4.2 08/10/2021   CL 104 08/10/2021   CO2 27 08/10/2021   GLUCOSE 103 (H) 08/10/2021   BUN 10 08/10/2021   CREATININE 0.78 08/10/2021   BILITOT 0.3 08/10/2021   ALKPHOS 58 12/20/2019   AST 12 08/10/2021   ALT 15 08/10/2021   PROT 7.5 08/10/2021   ALBUMIN 4.2 12/20/2019    CALCIUM 9.3 08/10/2021   GFRAA 128 01/03/2020   QFTBGOLDPLUS NEGATIVE 05/18/2021    Speciality Comments: [id number], Ttd with MTXx 6 tabs weekly X  56yr  Procedures:  No procedures performed Allergies: Patient has no known allergies.    Assessment / Plan:     Visit Diagnoses: Vasculitis on skin biopsy ([place] - 07/04/16: ANCA-, ANA-, RF<14, anti-CCP<16, TSH 0.59, ESR 14, CK 200, HX of remission on methotrexate in the past: She has noticed a significant clinical improvement in her symptoms since restarting on methotrexate 6 tablets once weekly after her last office visit on 05/18/2021.  At her last office visit she had an extensive rash on her trunk  and extremities which has improved.  She has some hyperpigmentation on her lower extremities but no active lesions were noted.  She is not experiencing any joint pain and has no synovitis on examination.  She has not had any muscle weakness or muscle tenderness.  CK was within normal limits: 38 on 05/18/2021.  She has not had any shortness of breath or pleuritic chest pain and her lungs were clear to auscultation on examination.  ESR was 28 on 05/18/2021.  ESR will be repeated today with routine CBC and CMP.  She will remain on methotrexate 6 tablets by mouth once weekly and folic acid 1 mg daily.  She does not need any refills at this time.  She was advised to notify us if she develops signs or symptoms of a flare.  She will follow-up in the office in 5 months.- Plan: Sedimentation rate  High risk medication use - Methotrexate 6 tablets by mouth once weekly and folic acid 1 mg daily.   - Plan: CBC with Differential/Platelet, COMPLETE METABOLIC PANEL WITH GFR CBC and CMP drawn on 08/10/2021. She is due to update lab work today.  Orders released.  Her next lab work will be due at end of May/early June 2023 and every 3 months to monitor for drug toxicity.  She has not had any recent infections. Discussed the importance of holding methotrexate if she develops signs  or symptoms of infection and to resume once infection is completely cleared.  Episcleritis of both eyes - Treated by Dr. Kathrin Penner at [facility] ophthalmology in the past.  She has not had any episodes since 2018.  She has not had any signs or symptoms of an episcleritis flare recently.  No conjunctival injection was noted on examination today.  She will remain on methotrexate as prescribed.  Elevated CK - CK was mildly elevated in the past.  CK within normal limits: 38 on 05/18/2021.  She is not experiencing any muscle weakness or muscle tenderness at this time.  Other medical conditions are listed as follows:  History of DVT (deep vein thrombosis) - 2012 after cast on her right leg.  Seasonal allergic rhinitis due to other allergic trigger  Former smoker - She quit smoking in 2020.  Half a pack per day for 30 years.  Alcohol use  Orders: Orders Placed This Encounter  Procedures   CBC with Differential/Platelet   COMPLETE METABOLIC PANEL WITH GFR   Sedimentation rate   No orders of the defined types were placed in this encounter.    Follow-Up Instructions: Return in about 5 months (around 03/21/2022) for Vasculitis, Episcleritis .   Ofilia Neas, PA-C  Note - This record has been created using Dragon software.  Chart creation errors have been sought, but may not always  have been located. Such creation errors do not reflect on  the standard of medical care.

## 2021-10-19 ENCOUNTER — Ambulatory Visit: Payer: BC Managed Care – PPO | Admitting: Physician Assistant

## 2021-10-20 DIAGNOSIS — R0789 Other chest pain: Secondary | ICD-10-CM | POA: Diagnosis not present

## 2021-10-20 DIAGNOSIS — R0982 Postnasal drip: Secondary | ICD-10-CM | POA: Diagnosis not present

## 2021-10-20 DIAGNOSIS — R053 Chronic cough: Secondary | ICD-10-CM | POA: Diagnosis not present

## 2021-10-22 ENCOUNTER — Ambulatory Visit: Payer: BC Managed Care – PPO | Admitting: Physician Assistant

## 2021-10-22 ENCOUNTER — Encounter: Payer: Self-pay | Admitting: Physician Assistant

## 2021-10-22 ENCOUNTER — Other Ambulatory Visit: Payer: Self-pay

## 2021-10-22 VITALS — BP 155/97 | HR 93 | Ht 68.0 in | Wt 269.2 lb

## 2021-10-22 DIAGNOSIS — Z79899 Other long term (current) drug therapy: Secondary | ICD-10-CM

## 2021-10-22 DIAGNOSIS — R748 Abnormal levels of other serum enzymes: Secondary | ICD-10-CM | POA: Diagnosis not present

## 2021-10-22 DIAGNOSIS — I776 Arteritis, unspecified: Secondary | ICD-10-CM

## 2021-10-22 DIAGNOSIS — Z86718 Personal history of other venous thrombosis and embolism: Secondary | ICD-10-CM

## 2021-10-22 DIAGNOSIS — Z87891 Personal history of nicotine dependence: Secondary | ICD-10-CM

## 2021-10-22 DIAGNOSIS — H15103 Unspecified episcleritis, bilateral: Secondary | ICD-10-CM | POA: Diagnosis not present

## 2021-10-22 DIAGNOSIS — Z789 Other specified health status: Secondary | ICD-10-CM

## 2021-10-22 DIAGNOSIS — J3089 Other allergic rhinitis: Secondary | ICD-10-CM

## 2021-10-23 LAB — COMPLETE METABOLIC PANEL WITH GFR
AG Ratio: 1.3 (calc) (ref 1.0–2.5)
ALT: 19 U/L (ref 6–29)
AST: 13 U/L (ref 10–35)
Albumin: 4.6 g/dL (ref 3.6–5.1)
Alkaline phosphatase (APISO): 59 U/L (ref 31–125)
BUN: 17 mg/dL (ref 7–25)
CO2: 24 mmol/L (ref 20–32)
Calcium: 9.6 mg/dL (ref 8.6–10.2)
Chloride: 102 mmol/L (ref 98–110)
Creat: 0.84 mg/dL (ref 0.50–0.99)
Globulin: 3.6 g/dL (calc) (ref 1.9–3.7)
Glucose, Bld: 106 mg/dL — ABNORMAL HIGH (ref 65–99)
Potassium: 4.3 mmol/L (ref 3.5–5.3)
Sodium: 138 mmol/L (ref 135–146)
Total Bilirubin: 0.3 mg/dL (ref 0.2–1.2)
Total Protein: 8.2 g/dL — ABNORMAL HIGH (ref 6.1–8.1)
eGFR: 85 mL/min/{1.73_m2} (ref >=60)

## 2021-10-23 LAB — CBC WITH DIFFERENTIAL/PLATELET
Absolute Monocytes: 187 cells/uL — ABNORMAL LOW (ref 200–950)
Basophils Absolute: 31 cells/uL (ref 0–200)
Basophils Relative: 0.8 %
Eosinophils Absolute: 148 cells/uL (ref 15–500)
Eosinophils Relative: 3.8 %
HCT: 40.5 % (ref 35.0–45.0)
Hemoglobin: 13.1 g/dL (ref 11.7–15.5)
Lymphs Abs: 1587 cells/uL (ref 850–3900)
MCH: 28.5 pg (ref 27.0–33.0)
MCHC: 32.3 g/dL (ref 32.0–36.0)
MCV: 88 fL (ref 80.0–100.0)
MPV: 10.4 fL (ref 7.5–12.5)
Monocytes Relative: 4.8 %
Neutro Abs: 1946 cells/uL (ref 1500–7800)
Neutrophils Relative %: 49.9 %
Platelets: 246 10*3/uL (ref 140–400)
RBC: 4.6 10*6/uL (ref 3.80–5.10)
RDW: 15.2 % — ABNORMAL HIGH (ref 11.0–15.0)
Total Lymphocyte: 40.7 %
WBC: 3.9 10*3/uL (ref 3.8–10.8)

## 2021-10-23 LAB — SEDIMENTATION RATE: Sed Rate: 25 mm/h — ABNORMAL HIGH (ref 0–20)

## 2021-10-23 NOTE — Progress Notes (Signed)
ESR is borderline elevated but has improved.

## 2021-10-23 NOTE — Progress Notes (Signed)
CBC stable.  Glucose is 106. Total protein is borderline elevated-8.2.  rest of CMP WNL.

## 2021-11-01 ENCOUNTER — Other Ambulatory Visit: Payer: Self-pay

## 2021-11-01 ENCOUNTER — Encounter: Payer: Self-pay | Admitting: Emergency Medicine

## 2021-11-01 ENCOUNTER — Ambulatory Visit (INDEPENDENT_AMBULATORY_CARE_PROVIDER_SITE_OTHER): Payer: BC Managed Care – PPO | Admitting: Emergency Medicine

## 2021-11-01 VITALS — BP 128/76 | HR 83 | Ht 68.0 in | Wt 269.0 lb

## 2021-11-01 DIAGNOSIS — Z0000 Encounter for general adult medical examination without abnormal findings: Secondary | ICD-10-CM

## 2021-11-01 DIAGNOSIS — Z1211 Encounter for screening for malignant neoplasm of colon: Secondary | ICD-10-CM

## 2021-11-01 NOTE — Patient Instructions (Signed)

## 2021-11-01 NOTE — Progress Notes (Signed)
Joyce Zhang 51 y.o.   Chief Complaint  Patient presents with   New Patient (Initial Visit)    Physical    HISTORY OF PRESENT ILLNESS: This is a 51 y.o. female first visit to this office, here for physical and to establish care with me. Past medical history includes vasculitis, on methotrexate.  Sees rheumatologist on a regular basis. Sees gynecologist Dr. Quincy Simmonds on a regular basis. No complaints or medical concerns today. Recent blood work results discussed with patient.  Unremarkable labs with acceptable values.  HPI   Prior to Admission medications   Medication Sig Start Date End Date Taking? Authorizing Provider  albuterol (VENTOLIN HFA) 108 (90 Base) MCG/ACT inhaler SMARTSIG:2 Puff(s) By Mouth Every 6 Hours PRN 05/24/21  Yes [provider]  B Complex Vitamins (B COMPLEX PO) Take by mouth daily.   Yes [provider]  CALCIUM PO Take by mouth daily.   Yes [provider]  cetirizine (ZYRTEC ALLERGY) 10 MG tablet Take 1 tablet (10 mg total) by mouth daily. 01/24/21  Yes Minette Brine, FNP  CINNAMON PO Take by mouth daily.   Yes [provider]  folic acid (FOLVITE) 1 MG tablet Take 1 tablet (1 mg total) by mouth daily. 05/24/21  Yes Ofilia Neas, PA-C  methotrexate (RHEUMATREX) 2.5 MG tablet Take 6 tablets (15 mg total) by mouth once a week. Caution:Chemotherapy. Protect from light. 08/14/21  Yes Ofilia Neas, PA-C  montelukast (SINGULAIR) 10 MG tablet Take 10 mg by mouth daily. 10/20/21  Yes [provider]  Multiple Vitamins-Minerals (ONE-A-DAY WOMENS PO) Take by mouth daily.   Yes [provider]  [place]   Yes [provider]  TURMERIC PO Take by mouth daily.   Yes [provider]  VITAMIN D PO Take by mouth daily.   Yes [provider]  montelukast (SINGULAIR) 10 MG tablet TAKE 1 TABLET(10 MG) BY MOUTH AT BEDTIME 04/23/21   Minette Brine, FNP    No Known  Allergies  Patient Active Problem List   Diagnosis Date Noted   Leukocytoclastic vasculitis ([place]) 09/16/2016   Alcohol use 09/13/2016   Vasculitis on skin biopsy ([place]) 07/02/2016   Episcleritis of both eyes 07/02/2016   High risk medication use 07/02/2016   History of DVT (deep vein thrombosis) 07/02/2016    Past Medical History:  Diagnosis Date   Bone spur of left foot    Dysmenorrhea    Episcleritis of both eyes    Fibroid    H/O vasculitis    in legs   Scleritis     Past Surgical History:  Procedure Laterality Date   broken foot Right    fracture of index finger Left     Social History   Socioeconomic History   Marital status: Single    Spouse name: Not on file   Number of children: Not on file   Years of education: Not on file   Highest education level: Not on file  Occupational History   Not on file  Tobacco Use   Smoking status: Former    Packs/day: 0.25    Types: Cigarettes    Quit date: 10/25/2018    Years since quitting: 3.0    Passive exposure: Past   Smokeless tobacco: Never   Tobacco comments:    5-7 cigarettes/night  Vaping Use   Vaping Use: Never used  Substance and Sexual Activity   Alcohol use: Yes    Alcohol/week: 3.0  standard drinks    Types: 3 Standard drinks or equivalent per week    Comment: socially   Drug use: No   Sexual activity: Yes    Partners: Female    Birth control/protection: None  Other Topics Concern   Not on file  Social History Narrative   Not on file   Social Determinants of Health   Financial Resource Strain: Not on file  Food Insecurity: Not on file  Transportation Needs: Not on file  Physical Activity: Not on file  Stress: Not on file  Social Connections: Not on file  Intimate Partner Violence: Not on file    Family History  Adopted: Yes  Problem Relation Age of Onset   Diabetes Mother    Asthma Mother    Breast cancer Mother    Diabetes Maternal Grandmother    Asthma Maternal Grandmother     Diabetes Maternal Grandfather      Review of Systems  Constitutional: Negative.  Negative for chills and fever.  HENT: Negative.  Negative for congestion and sore throat.   Respiratory: Negative.  Negative for cough and shortness of breath.   Cardiovascular: Negative.  Negative for chest pain and palpitations.  Gastrointestinal: Negative.  Negative for abdominal pain, diarrhea, nausea and vomiting.  Genitourinary: Negative.  Negative for dysuria and hematuria.  Musculoskeletal: Negative.   Skin: Negative.  Negative for rash.  Neurological: Negative.  Negative for dizziness and headaches.  All other systems reviewed and are negative.  Today's Vitals   11/01/21 1551  BP: 128/76  Pulse: 83  SpO2: 98%  Weight: 269 lb (122 kg)  Height: '5\' 8"'$  (1.727 m)   Body mass index is 40.9 kg/m.  Physical Exam Vitals reviewed.  Constitutional:      Appearance: Normal appearance. She is obese.  HENT:     Head: Normocephalic.     Right Ear: Tympanic membrane, ear canal and external ear normal.     Left Ear: Tympanic membrane, ear canal and external ear normal.  Eyes:     Extraocular Movements: Extraocular movements intact.     Conjunctiva/sclera: Conjunctivae normal.     Pupils: Pupils are equal, round, and reactive to light.  Cardiovascular:     Rate and Rhythm: Normal rate and regular rhythm.     Pulses: Normal pulses.     Heart sounds: Normal heart sounds.  Pulmonary:     Effort: Pulmonary effort is normal.     Breath sounds: Normal breath sounds.  Abdominal:     General: Bowel sounds are normal. There is no distension.     Palpations: Abdomen is soft.     Tenderness: There is no abdominal tenderness.  Musculoskeletal:     Cervical back: No tenderness.     Right lower leg: No edema.     Left lower leg: No edema.  Lymphadenopathy:     Cervical: No cervical adenopathy.  Skin:    General: Skin is warm and dry.     Capillary Refill: Capillary refill takes less than 2 seconds.   Neurological:     General: No focal deficit present.     Mental Status: She is alert and oriented to person, place, and time.  Psychiatric:        Mood and Affect: Mood normal.        Behavior: Behavior normal.     ASSESSMENT & PLAN: Problem List Items Addressed This Visit   None Visit Diagnoses     Routine general medical examination at a  health care facility    -  Primary   Colon cancer screening       Relevant Orders   Ambulatory referral to Gastroenterology      Modifiable risk factors discussed with patient. Anticipatory guidance according to age provided. The following topics were also discussed: Social Determinants of Health Smoking.  No longer smoking Diet and nutrition and need to decrease amount of daily carbohydrate intake Benefits of exercise Cancer screening and need for colon cancer screening with colonoscopy Vaccinations recommendations Cardiovascular risk assessment Mental health including depression and anxiety Fall and accident prevention  Patient Instructions  Health Maintenance, Female Adopting a healthy lifestyle and getting preventive care are important in promoting health and wellness. Ask your health care provider about: The right schedule for you to have regular tests and exams. Things you can do on your own to prevent diseases and keep yourself healthy. What should I know about diet, weight, and exercise? Eat a healthy diet  Eat a diet that includes plenty of vegetables, fruits, low-fat dairy products, and lean protein. Do not eat a lot of foods that are high in solid fats, added sugars, or sodium. Maintain a healthy weight Body mass index (BMI) is used to identify weight problems. It estimates body fat based on height and weight. Your health care provider can help determine your BMI and help you achieve or maintain a healthy weight. Get regular exercise Get regular exercise. This is one of the most important things you can do for your  health. Most adults should: Exercise for at least 150 minutes each week. The exercise should increase your heart rate and make you sweat (moderate-intensity exercise). Do strengthening exercises at least twice a week. This is in addition to the moderate-intensity exercise. Spend less time sitting. Even light physical activity can be beneficial. Watch cholesterol and blood lipids Have your blood tested for lipids and cholesterol at 50 years of age, then have this test every 5 years. Have your cholesterol levels checked more often if: Your lipid or cholesterol levels are high. You are older than 50 years of age. You are at high risk for heart disease. What should I know about cancer screening? Depending on your health history and family history, you may need to have cancer screening at various ages. This may include screening for: Breast cancer. Cervical cancer. Colorectal cancer. Skin cancer. Lung cancer. What should I know about heart disease, diabetes, and high blood pressure? Blood pressure and heart disease High blood pressure causes heart disease and increases the risk of stroke. This is more likely to develop in people who have high blood pressure readings or are overweight. Have your blood pressure checked: Every 3-5 years if you are 30-59 years of age. Every year if you are 30 years old or older. Diabetes Have regular diabetes screenings. This checks your fasting blood sugar level. Have the screening done: Once every three years after age 49 if you are at a normal weight and have a low risk for diabetes. More often and at a younger age if you are overweight or have a high risk for diabetes. What should I know about preventing infection? Hepatitis B If you have a higher risk for hepatitis B, you should be screened for this virus. Talk with your health care provider to find out if you are at risk for hepatitis B infection. Hepatitis C Testing is recommended for: Everyone born  from [birth year] through 1964. Anyone with known risk factors for hepatitis C. Sexually transmitted  infections (STIs) Get screened for STIs, including gonorrhea and chlamydia, if: You are sexually active and are younger than 49 years of age. You are older than 50 years of age and your health care provider tells you that you are at risk for this type of infection. Your sexual activity has changed since you were last screened, and you are at increased risk for chlamydia or gonorrhea. Ask your health care provider if you are at risk. Ask your health care provider about whether you are at high risk for HIV. Your health care provider may recommend a prescription medicine to help prevent HIV infection. If you choose to take medicine to prevent HIV, you should first get tested for HIV. You should then be tested every 3 months for as long as you are taking the medicine. Pregnancy If you are about to stop having your period (premenopausal) and you may become pregnant, seek counseling before you get pregnant. Take 400 to 800 micrograms (mcg) of folic acid every day if you become pregnant. Ask for birth control (contraception) if you want to prevent pregnancy. Osteoporosis and menopause Osteoporosis is a disease in which the bones lose minerals and strength with aging. This can result in bone fractures. If you are 57 years old or older, or if you are at risk for osteoporosis and fractures, ask your health care provider if you should: Be screened for bone loss. Take a calcium or vitamin D supplement to lower your risk of fractures. Be given hormone replacement therapy (HRT) to treat symptoms of menopause. Follow these instructions at home: Alcohol use Do not drink alcohol if: Your health care provider tells you not to drink. You are pregnant, may be pregnant, or are planning to become pregnant. If you drink alcohol: Limit how much you have to: 0-1 drink a day. Know how much alcohol is in your drink. In the  [place] one drink equals one 12 oz bottle of beer (355 mL), one 5 oz glass of wine (148 mL), or one 1 oz glass of hard liquor (44 mL). Lifestyle Do not use any products that contain nicotine or tobacco. These products include cigarettes, chewing tobacco, and vaping devices, such as e-cigarettes. If you need help quitting, ask your health care provider. Do not use street drugs. Do not share needles. Ask your health care provider for help if you need support or information about quitting drugs. General instructions Schedule regular health, dental, and eye exams. Stay current with your vaccines. Tell your health care provider if: You often feel depressed. You have ever been abused or do not feel safe at home. Summary Adopting a healthy lifestyle and getting preventive care are important in promoting health and wellness. Follow your health care provider's instructions about healthy diet, exercising, and getting tested or screened for diseases. Follow your health care provider's instructions on monitoring your cholesterol and blood pressure. This information is not intended to replace advice given to you by your health care provider. Make sure you discuss any questions you have with your health care provider. Document Revised: 01/01/2021 Document Reviewed: 01/01/2021 Elsevier Patient Education  2022 [place], MD [facility]

## 2021-11-02 ENCOUNTER — Other Ambulatory Visit: Payer: Self-pay | Admitting: Rheumatology

## 2021-11-02 MED ORDER — METHOTREXATE 2.5 MG PO TABS: 15.0000 mg | ORAL_TABLET | ORAL | 0 refills | Status: DC

## 2021-11-02 NOTE — Telephone Encounter (Signed)
Patient called the office requested a refill of Methotrexate be sent to [facility] on [address]. Patient states she took her weekly dose today and will need it again before Friday 3/17. ?

## 2021-11-02 NOTE — Telephone Encounter (Signed)
Next Visit: due 02/2022, message sent to the front desk to schedule.  ? ?Last Visit: 10/22/2021 ? ?Last Fill: 08/14/2021 ? ?DX: Vasculitis on skin biopsy ? ?Current Dose per office note on 10/22/2021: Methotrexate 6 tablets by mouth once weekly  ? ?Labs: 10/22/2021 CBC stable.  ?Glucose is 106. Total protein is borderline elevated-8.2.  rest of CMP WNL.  ? ?Okay to refill methotrexate?  ?

## 2021-11-05 ENCOUNTER — Telehealth: Payer: Self-pay

## 2021-11-05 NOTE — Telephone Encounter (Signed)
LMOM to schedule follow-up appointment 

## 2021-11-05 NOTE — Progress Notes (Deleted)
51 y.o. G0P0000 Single African American female here for annual exam.   ? ?PCP:    ? ?No LMP recorded. Patient has had an injection.     ?  ?    ?Sexually active: {yes no:314532}  ?The current method of family planning is*** Depo-Provera injections.    ?Exercising: {yes no:314532}  {types:19826} ?Smoker:  ***quit 2020 ? ?Health Maintenance: ?Pap:  12-20-19 Neg:Neg HR HPV, 05-17-15 Neg:Neg HR HPV, 01-27-14 Neg:Neg HR HPV  ?History of abnormal Pap:  no ?MMG:  02-16-21 Neg/BiRads1 ?Colonoscopy:  *** ?BMD:   ***  Result  *** ?TDaP:  ***2012 ?Gardasil:   no ?HIV: 05-17-15 NR  ?Hep C: 05-17-15 Neg ?Screening Labs:  Hb today: ***, Urine today: *** ? ? reports that she quit smoking about 3 years ago. Her smoking use included cigarettes. She smoked an average of .25 packs per day. She has been exposed to tobacco smoke. She has never used smokeless tobacco. She reports current alcohol use of about 3.0 standard drinks per week. She reports that she does not use drugs. ? ?Past Medical History:  ?Diagnosis Date  ? Bone spur of left foot   ? Dysmenorrhea   ? Episcleritis of both eyes   ? Fibroid   ? H/O vasculitis   ? in legs  ? Scleritis   ? ? ?Past Surgical History:  ?Procedure Laterality Date  ? broken foot Right   ? fracture of index finger Left   ? ? ?Current Outpatient Medications  ?Medication Sig Dispense Refill  ? albuterol (VENTOLIN HFA) 108 (90 Base) MCG/ACT inhaler SMARTSIG:2 Puff(s) By Mouth Every 6 Hours PRN    ? B Complex Vitamins (B COMPLEX PO) Take by mouth daily.    ? CALCIUM PO Take by mouth daily.    ? cetirizine (ZYRTEC ALLERGY) 10 MG tablet Take 1 tablet (10 mg total) by mouth daily. 90 tablet 1  ? CINNAMON PO Take by mouth daily.    ? folic acid (FOLVITE) 1 MG tablet Take 1 tablet (1 mg total) by mouth daily. 90 tablet 3  ? methotrexate (RHEUMATREX) 2.5 MG tablet Take 6 tablets (15 mg total) by mouth once a week. Caution:Chemotherapy. Protect from light. 72 tablet 0  ? montelukast (SINGULAIR) 10 MG tablet Take 10 mg  by mouth daily.    ? Multiple Vitamins-Minerals (ONE-A-DAY WOMENS PO) Take by mouth daily.    ? OVER THE COUNTER MEDICATION Sea moss    ? TURMERIC PO Take by mouth daily.    ? VITAMIN D PO Take by mouth daily.    ? ?No current facility-administered medications for this visit.  ? ? ?Family History  ?Adopted: Yes  ?Problem Relation Age of Onset  ? Diabetes Mother   ? Asthma Mother   ? Breast cancer Mother   ? Diabetes Maternal Grandmother   ? Asthma Maternal Grandmother   ? Diabetes Maternal Grandfather   ? ? ?Review of Systems ? ?Exam:   ?There were no vitals taken for this visit.    ?General appearance: alert, cooperative and appears stated age ?Head: normocephalic, without obvious abnormality, atraumatic ?Neck: no adenopathy, supple, symmetrical, trachea midline and thyroid normal to inspection and palpation ?Lungs: clear to auscultation bilaterally ?Breasts: normal appearance, no masses or tenderness, No nipple retraction or dimpling, No nipple discharge or bleeding, No axillary adenopathy ?Heart: regular rate and rhythm ?Abdomen: soft, non-tender; no masses, no organomegaly ?Extremities: extremities normal, atraumatic, no cyanosis or edema ?Skin: skin color, texture, turgor normal. No rashes  or lesions ?Lymph nodes: cervical, supraclavicular, and axillary nodes normal. ?Neurologic: grossly normal ? ?Pelvic: External genitalia:  no lesions ?             No abnormal inguinal nodes palpated. ?             Urethra:  normal appearing urethra with no masses, tenderness or lesions ?             Bartholins and Skenes: normal    ?             Vagina: normal appearing vagina with normal color and discharge, no lesions ?             Cervix: no lesions ?             Pap taken: {yes no:314532} ?Bimanual Exam:  Uterus:  normal size, contour, position, consistency, mobility, non-tender ?             Adnexa: no mass, fullness, tenderness ?             Rectal exam: {yes no:314532}.  Confirms. ?             Anus:  normal sphincter  tone, no lesions ? ?Chaperone was present for exam:  *** ? ?Assessment:   ?Well woman visit with gynecologic exam. ? ? ?Plan: ?Mammogram screening discussed. ?Self breast awareness reviewed. ?Pap and HR HPV as above. ?Guidelines for Calcium, Vitamin D, regular exercise program including cardiovascular and weight bearing exercise. ?  ?Follow up annually and prn.  ? ?Additional counseling given.  {yes [id number]. ?_______ minutes face to face time of which over 50% was spent in counseling.  ? ? ?After visit summary provided.  ? ? ? ?

## 2021-11-05 NOTE — Telephone Encounter (Signed)
-----   Message from [place] sent at 11/02/2021  9:24 AM EST ----- ?Patient is due for a follow up in July 2023. Please call and schedule. Thanks!  ? ?

## 2021-11-06 ENCOUNTER — Ambulatory Visit: Payer: BC Managed Care – PPO | Admitting: Obstetrics and Gynecology

## 2021-11-27 ENCOUNTER — Ambulatory Visit
Admission: RE | Admit: 2021-11-27 | Discharge: 2021-11-27 | Disposition: A | Discharge status: Home / Self Care | Payer: BC Managed Care – PPO | Source: Ambulatory Visit | Attending: Emergency Medicine | Admitting: Emergency Medicine

## 2021-11-27 VITALS — BP 134/87 | HR 96 | Temp 98.6°F | Resp 20

## 2021-11-27 DIAGNOSIS — B349 Viral infection, unspecified: Secondary | ICD-10-CM

## 2021-11-27 MED ORDER — DICYCLOMINE HCL 10 MG PO CAPS: 10.0000 mg | ORAL_CAPSULE | Freq: Three times a day (TID) | ORAL | 0 refills | Status: DC

## 2021-11-27 MED ORDER — ONDANSETRON 4 MG PO TBDP: 4.0000 mg | ORAL_TABLET | Freq: Three times a day (TID) | ORAL | 0 refills | Status: AC | PRN

## 2021-11-27 MED ORDER — IBUPROFEN 400 MG PO TABS: 400.0000 mg | ORAL_TABLET | Freq: Three times a day (TID) | ORAL | 0 refills | Status: AC | PRN

## 2021-11-27 NOTE — ED Triage Notes (Signed)
Pt presents with c/o vomiting and diarrhea. Also had cough and body aches last week  ?

## 2021-11-27 NOTE — ED Provider Notes (Signed)
?[facility] ? ? ? ?CSN: [id number] ?Arrival date & time: 11/27/21  1033 ?  ? ?HISTORY  ? ?Chief Complaint  ?Patient presents with  ? Diarrhea  ?  Diarrhea, chills, sweats, vomiting, cough - Entered by patient  ? Emesis  ? ?HPI ?Joyce Zhang is a 51 y.o. female. Patient complains of nonbilious, nonbloody vomiting and diarrhea.  Patient reports she had cough and body aches last week.  Patient has normal vital signs on arrival.  Patient states that today she is overall feeling better.  Patient states she is hungry.  Last episode of vomiting was this morning, last episode of diarrhea was yesterday. ? ?The history is provided by the patient.  ?Past Medical History:  ?Diagnosis Date  ? Bone spur of left foot   ? Dysmenorrhea   ? Episcleritis of both eyes   ? Fibroid   ? H/O vasculitis   ? in legs  ? Scleritis   ? ?Patient Active Problem List  ? Diagnosis Date Noted  ? Leukocytoclastic vasculitis ([place]) 09/16/2016  ? Alcohol use 09/13/2016  ? Vasculitis on skin biopsy ([place]) 07/02/2016  ? Episcleritis of both eyes 07/02/2016  ? High risk medication use 07/02/2016  ? History of DVT (deep vein thrombosis) 07/02/2016  ? ?Past Surgical History:  ?Procedure Laterality Date  ? broken foot Right   ? fracture of index finger Left   ? ?OB History   ? ? Gravida  ?0  ? Para  ?0  ? Term  ?0  ? Preterm  ?0  ? AB  ?0  ? Living  ?0  ?  ? ? SAB  ?0  ? IAB  ?0  ? Ectopic  ?0  ? Multiple  ?0  ? Live Births  ?0  ?   ?  ?  ? ?Home Medications   ? ?Prior to Admission medications   ?Medication Sig Start Date End Date Taking? Authorizing Provider  ?albuterol (VENTOLIN HFA) 108 (90 Base) MCG/ACT inhaler SMARTSIG:2 Puff(s) By Mouth Every 6 Hours PRN 05/24/21   [provider]  ?B Complex Vitamins (B COMPLEX PO) Take by mouth daily.    [provider]  ?CALCIUM PO Take by mouth daily.    [provider]  ?cetirizine (ZYRTEC ALLERGY) 10 MG tablet Take 1 tablet (10 mg total) by mouth daily. 01/24/21   Minette Brine, FNP  ?CINNAMON PO Take by mouth daily.    [provider]  ?folic acid (FOLVITE) 1 MG tablet Take 1 tablet (1 mg total) by mouth daily. 05/24/21   Ofilia Neas, PA-C  ?methotrexate (RHEUMATREX) 2.5 MG tablet Take 6 tablets (15 mg total) by mouth once a week. Caution:Chemotherapy. Protect from light. 11/02/21   Ofilia Neas, PA-C  ?montelukast (SINGULAIR) 10 MG tablet Take 10 mg by mouth daily. 10/20/21   [provider]  ?Multiple Vitamins-Minerals (ONE-A-DAY WOMENS PO) Take by mouth daily.    [provider]  ?[place] moss    [provider]  ?TURMERIC PO Take by mouth daily.    [provider]  ?VITAMIN D PO Take by mouth daily.    [provider]  ? ?Family History ?Family History  ?Adopted: Yes  ?Problem Relation Age of Onset  ? Diabetes Mother   ? Asthma Mother   ? Breast cancer Mother   ? Diabetes Maternal Grandmother   ? Asthma Maternal Grandmother   ? Diabetes Maternal Grandfather   ? ?Social  History ?Social History  ? ?Tobacco Use  ? Smoking status: Former  ?  Packs/day: 0.25  ?  Types: Cigarettes  ?  Quit date: 10/25/2018  ?  Years since quitting: 3.0  ?  Passive exposure: Past  ? Smokeless tobacco: Never  ? Tobacco comments:  ?  5-7 cigarettes/night  ?Vaping Use  ? Vaping Use: Never used  ?Substance Use Topics  ? Alcohol use: Yes  ?  Alcohol/week: 3.0 standard drinks  ?  Types: 3 Standard drinks or equivalent per week  ?  Comment: socially  ? Drug use: No  ? ?Allergies   ?Patient has no known allergies. ? ?Review of Systems ?Review of Systems ?Pertinent findings noted in history of present illness.  ? ?Physical Exam ?Triage Vital Signs ?ED Triage Vitals  ?Enc Vitals Group  ?   BP 06/22/21 0827 (!) 147/82  ?   Pulse Rate 06/22/21 0827 72  ?   Resp 06/22/21 0827 18  ?   Temp 06/22/21 0827 98.3 ?F (36.8 ?C)  ?   Temp Source 06/22/21 0827 Oral  ?   SpO2 06/22/21 0827 98 %  ?   Weight --   ?   Height --   ?   Head  Circumference --   ?   Peak Flow --   ?   Pain Score 06/22/21 0826 5  ?   Pain Loc --   ?   Pain Edu? --   ?   Excl. in [place]? --   ?No data found. ? ?Updated Vital Signs ?BP 134/87   Pulse 96   Temp 98.6 ?F (37 ?C)   Resp 20   SpO2 97%  ? ?Physical Exam ?Vitals and nursing note reviewed.  ?Constitutional:   ?   General: She is not in acute distress. ?   Appearance: Normal appearance. She is not ill-appearing.  ?HENT:  ?   Head: Normocephalic and atraumatic.  ?   Salivary Glands: Right salivary gland is not diffusely enlarged or tender. Left salivary gland is not diffusely enlarged or tender.  ?   Right Ear: Tympanic membrane, ear canal and external ear normal. No drainage. No middle ear effusion. There is no impacted cerumen. Tympanic membrane is not erythematous or bulging.  ?   Left Ear: Tympanic membrane, ear canal and external ear normal. No drainage.  No middle ear effusion. There is no impacted cerumen. Tympanic membrane is not erythematous or bulging.  ?   Nose: Nose normal. No nasal deformity, septal deviation, mucosal edema, congestion or rhinorrhea.  ?   Right Turbinates: Not enlarged, swollen or pale.  ?   Left Turbinates: Not enlarged, swollen or pale.  ?   Right Sinus: No maxillary sinus tenderness or frontal sinus tenderness.  ?   Left Sinus: No maxillary sinus tenderness or frontal sinus tenderness.  ?   Mouth/Throat:  ?   Lips: Pink. No lesions.  ?   Mouth: Mucous membranes are moist. No oral lesions.  ?   Pharynx: Oropharynx is clear. Uvula midline. No posterior oropharyngeal erythema or uvula swelling.  ?   Tonsils: No tonsillar exudate. 0 on the right. 0 on the left.  ?Eyes:  ?   General: Lids are normal.     ?   Right eye: No discharge.     ?   Left eye: No discharge.  ?   Extraocular Movements: Extraocular movements intact.  ?   Conjunctiva/sclera: Conjunctivae normal.  ?   Right eye:  Right conjunctiva is not injected.  ?   Left eye: Left conjunctiva is not injected.  ?Neck:  ?   Trachea:  Trachea and phonation normal.  ?Cardiovascular:  ?   Rate and Rhythm: Normal rate and regular rhythm.  ?   Pulses: Normal pulses.  ?   Heart sounds: Normal heart sounds. No murmur heard. ?  No friction rub. No gallop.  ?Pulmonary:  ?   Effort: Pulmonary effort is normal. No accessory muscle usage, prolonged expiration or respiratory distress.  ?   Breath sounds: Normal breath sounds. No stridor, decreased air movement or transmitted upper airway sounds. No decreased breath sounds, wheezing, rhonchi or rales.  ?Chest:  ?   Chest wall: No tenderness.  ?Musculoskeletal:     ?   General: Normal range of motion.  ?   Cervical back: Normal range of motion and neck supple. Normal range of motion.  ?Lymphadenopathy:  ?   Cervical: No cervical adenopathy.  ?Skin: ?   General: Skin is warm and dry.  ?   Findings: No erythema or rash.  ?Neurological:  ?   General: No focal deficit present.  ?   Mental Status: She is alert and oriented to person, place, and time.  ?Psychiatric:     ?   Mood and Affect: Mood normal.     ?   Behavior: Behavior normal.  ? ? ?Visual Acuity ?Right Eye Distance:   ?Left Eye Distance:   ?Bilateral Distance:   ? ?Right Eye Near:   ?Left Eye Near:    ?Bilateral Near:    ? ?UC Couse / Diagnostics / Procedures:  ?  ?EKG ? ?Radiology ?No results found. ? ?Procedures ?Procedures (including critical care time) ? ?UC Diagnoses / Final Clinical Impressions(s)   ?I have reviewed the triage vital signs and the nursing notes. ? ?Pertinent labs & imaging results that were available during my care of the patient were reviewed by me and considered in my medical decision making (see chart for details).   ?Final diagnoses:  ?Viral illness  ? ?COVID flu testing performed at patient's request.  Patient provided with prescriptions for Zofran, Bentyl and ibuprofen at her request.  Return precautions advised.  If patient tests positive for COVID, I do not believe that antivirals are indicated given patient's otherwise  asymptomatic presentation and unremarkable physical exam. ? ?ED Prescriptions   ? ? Medication Sig Dispense Auth. Provider  ? ondansetron (ZOFRAN-ODT) 4 MG disintegrating tablet Take 1 tablet (4 mg total) by mouth every 8 (

## 2021-11-27 NOTE — Discharge Instructions (Addendum)
Your symptoms and physical exam findings are concerning for a viral illness. ? ?I provided you with a prescription for Zofran for nausea and Bentyl for diarrhea.  I also provided you a prescription for ibuprofen of 4 mg that you can take 3 times daily as needed. ? ?Please read the enclosed list of foods that help relieve diarrhea for your information. ? ?Thank you for visiting urgent care today.  We will notify you of the results of your COVID and flu test once received. ?

## 2021-11-30 ENCOUNTER — Telehealth (HOSPITAL_COMMUNITY): Payer: Self-pay | Admitting: Emergency Medicine

## 2021-11-30 NOTE — Telephone Encounter (Signed)
Patient left voicemail looking for the results of her COVID test from recent visit.  Was not ordered.  Attempted to return patient's call to review and encouraged recollect, LVM ?

## 2021-11-30 NOTE — Telephone Encounter (Signed)
Reviewed with patient the option to return for a recollect, even at our [address] location where she could get a result sooner, and patient states at this time she would like to take a home test and rely on the results of that.  Encouraged her to return call if she changed her mind so I could update staff on site.  She asked to confirm if she would be charged for the COVID test that was supposed to be ordered, and I reassured her that neither she nor her insurance company would be charged for a COVID test.  She verbalized understanding.   ?

## 2021-12-07 NOTE — Progress Notes (Signed)
51 y.o. G0P0000 Single African American female here for annual exam.   ? ?Patient wants to restart Depo Provera today also. ?Not having a cycle unless she is not getting Depo Provera every 3 months.  ?[place] Depo Provera was 07/23/21.  ? ?Some hot flashes.  ? ?Lost 23 pounds.  ? ?Has some itching under her breasts.  ?She notes skin color change there.  ? ?Declines STD screening.  ? ?Taking MTX for treatment of vasculitis.  ? ?May move to [place] area. ? ?PCP:   Dr. Agustina Caroli  ? ?Patient's last menstrual period was 11/20/2021.     ?Period Duration (Days): 3 ?Menstrual Flow: Light ?Dysmenorrhea: None ?    ?Sexually active: Yes.    ?The current method of family planning is female partner.    ?Exercising: Yes.     Cardio, strength  ?Smoker:  former- quit 2020 ? ?Health Maintenance: ?Pap:  12-20-19 Neg:Neg HR HPV, 05-17-15 Neg:Neg HR HPV, 01-27-14 Neg:Neg HR HPV ?History of abnormal Pap:  no ?MMG:  02-16-21 Neg/BiRads1 ?Colonoscopy:  none.  PCP has ordered this.  ?BMD:   none  Result  none ?TDaP:  2012 ?Gardasil:   no ?HIV: 05-17-15 NR ?Hep C: 05-17-15 Neg ?Screening Labs:  Hb today: discuss with provider, Urine today: not able to void at this time ? ? reports that she quit smoking about 3 years ago. Her smoking use included cigarettes. She smoked an average of .25 packs per day. She has been exposed to tobacco smoke. She has never used smokeless tobacco. She reports current alcohol use of about 3.0 standard drinks per week. She reports that she does not use drugs. ? ?Past Medical History:  ?Diagnosis Date  ? Bone spur of left foot   ? Dysmenorrhea   ? Episcleritis of both eyes   ? Fibroid   ? H/O vasculitis   ? in legs  ? Scleritis   ? ? ?Past Surgical History:  ?Procedure Laterality Date  ? broken foot Right   ? fracture of index finger Left   ? ? ?Current Outpatient Medications  ?Medication Sig Dispense Refill  ? albuterol (VENTOLIN HFA) 108 (90 Base) MCG/ACT inhaler SMARTSIG:2 Puff(s) By Mouth Every 6 Hours PRN    ? B  Complex Vitamins (B COMPLEX PO) Take by mouth daily.    ? CALCIUM PO Take by mouth daily.    ? cetirizine (ZYRTEC ALLERGY) 10 MG tablet Take 1 tablet (10 mg total) by mouth daily. 90 tablet 1  ? CINNAMON PO Take by mouth daily.    ? folic acid (FOLVITE) 1 MG tablet Take 1 tablet (1 mg total) by mouth daily. 90 tablet 3  ? methotrexate (RHEUMATREX) 2.5 MG tablet Take 6 tablets (15 mg total) by mouth once a week. Caution:Chemotherapy. Protect from light. 72 tablet 0  ? montelukast (SINGULAIR) 10 MG tablet Take 10 mg by mouth daily.    ? Multiple Vitamins-Minerals (ONE-A-DAY WOMENS PO) Take by mouth daily.    ? OVER THE COUNTER MEDICATION [place] moss    ? TURMERIC PO Take by mouth daily.    ? VITAMIN D PO Take by mouth daily.    ? dicyclomine (BENTYL) 10 MG capsule Take 1 capsule (10 mg total) by mouth 4 (four) times daily -  before meals and at bedtime for 3 days. 12 capsule 0  ? ?No current facility-administered medications for this visit.  ? ? ?Family History  ?Adopted: Yes  ?Problem Relation Age of Onset  ? Diabetes Mother   ?  Asthma Mother   ? Breast cancer Mother   ? Diabetes Maternal Grandmother   ? Asthma Maternal Grandmother   ? Diabetes Maternal Grandfather   ? ? ?Review of Systems  ?All other systems reviewed and are negative. ? ?Exam:   ?BP 136/80 (BP Location: Left Arm, Patient Position: Sitting, Cuff Size: Large)   Pulse 78   Resp 12   Ht '5\' 7"'$  (1.702 m)   Wt 270 lb (122.5 kg)   LMP 11/20/2021   BMI 42.29 kg/m?     ?General appearance: alert, cooperative and appears stated age ?Head: normocephalic, without obvious abnormality, atraumatic ?Neck: no adenopathy, supple, symmetrical, trachea midline and thyroid normal to inspection and palpation ?Lungs: clear to auscultation bilaterally ?Breasts: normal appearance, no masses or tenderness, No nipple retraction or dimpling, No nipple discharge or bleeding, No axillary adenopathy.  Increased pigmentation of skin under breasts.  No raised lesion. ?Heart:  regular rate and rhythm ?Abdomen: soft, non-tender; no masses, no organomegaly ?Extremities: extremities normal, atraumatic, no cyanosis or edema ?Skin: skin color, texture, turgor normal. No rashes or lesions ?Lymph nodes: cervical, supraclavicular, and axillary nodes normal. ?Neurologic: grossly normal ? ?Pelvic: External genitalia:  no lesions ?             No abnormal inguinal nodes palpated. ?             Urethra:  normal appearing urethra with no masses, tenderness or lesions ?             Bartholins and Skenes: normal    ?             Vagina: normal appearing vagina with normal color and discharge, no lesions ?             Cervix: no lesions ?             Pap taken: no ?Bimanual Exam:  Uterus:  normal size, contour, position, consistency, mobility, non-tender ?             Adnexa: no mass, fullness, tenderness ?             Rectal exam: yes.  Confirms. ?             Anus:  normal sphincter tone, no lesions ? ?Chaperone was present for exam:  Estill Bamberg, CMA ? ?Assessment:   ?Well woman visit with gynecologic exam. ?Uterine fibroids.  ?FH breast cancer. ?Amenorrhea. ?Hx vasculitis.  On MTX.  ?Candida of flexural skin.  ? ?Plan: ?Mammogram screening discussed. ?Self breast awareness reviewed. ?Pap and HR HPV 2026. ?Guidelines for Calcium, Vitamin D, regular exercise program including cardiovascular and weight bearing exercise. ?Depo Provera 150 mg IM q 3 months for one year.  Ok to restart today.  ?TDap today. ?Rx for Nystatin powder.  ?Ok to use hydrocortisone cream under breasts for itching if Nystatin powder is not adequate. ?Follow up annually and prn.  ? ?After visit summary provided.  ? ? ? ?

## 2021-12-11 ENCOUNTER — Encounter: Payer: Self-pay | Admitting: Obstetrics and Gynecology

## 2021-12-11 ENCOUNTER — Ambulatory Visit (INDEPENDENT_AMBULATORY_CARE_PROVIDER_SITE_OTHER): Payer: BC Managed Care – PPO | Admitting: Obstetrics and Gynecology

## 2021-12-11 VITALS — BP 136/80 | HR 78 | Resp 12 | Ht 67.0 in | Wt 270.0 lb

## 2021-12-11 DIAGNOSIS — Z01419 Encounter for gynecological examination (general) (routine) without abnormal findings: Secondary | ICD-10-CM

## 2021-12-11 DIAGNOSIS — N946 Dysmenorrhea, unspecified: Secondary | ICD-10-CM | POA: Diagnosis not present

## 2021-12-11 DIAGNOSIS — Z23 Encounter for immunization: Secondary | ICD-10-CM

## 2021-12-11 DIAGNOSIS — Z3042 Encounter for surveillance of injectable contraceptive: Secondary | ICD-10-CM

## 2021-12-11 MED ORDER — NYSTATIN 100000 UNIT/GM EX POWD: 1.0000 "application " | Freq: Three times a day (TID) | CUTANEOUS | 3 refills | Status: DC

## 2021-12-11 MED ORDER — MEDROXYPROGESTERONE ACETATE 150 MG/ML IM SUSP
150.0000 mg | Freq: Once | INTRAMUSCULAR | Status: AC
  Administered 2021-12-11: 150 mg via INTRAMUSCULAR

## 2021-12-11 NOTE — Patient Instructions (Signed)

## 2022-02-01 ENCOUNTER — Other Ambulatory Visit: Payer: Self-pay | Admitting: Physician Assistant

## 2022-02-01 NOTE — Telephone Encounter (Signed)
Next Visit: due 02/2022, message sent to the front desk to schedule  Last Visit: 10/22/2021  Last Fill: 11/02/2021  DX: Vasculitis on skin biopsy  Current Dose per office note 10/22/2021:  Methotrexate 6 tablets by mouth once weekly  Labs: 10/22/2021 CBC stable.  Glucose is 106. Total protein is borderline elevated-8.2.  rest of CMP WNL.   Okay to refill MTX?

## 2022-02-05 ENCOUNTER — Telehealth: Payer: Self-pay

## 2022-02-05 ENCOUNTER — Ambulatory Visit: Payer: BC Managed Care – PPO

## 2022-02-05 NOTE — Telephone Encounter (Signed)
Thank you for the update. You may close the encounter. 

## 2022-02-05 NOTE — Telephone Encounter (Signed)
FYI. Pt is scheduled today for depo provera injection. However, last injection was given at [facility] on 12/11/21 which would make her next injection dates to be btwn 7/4-7/18.   LDVM on machine per DPR to inform pt that she is on the schedule too early for her injection and that I will send the schedulers a msg to call her and r/s for the correct time frame.

## 2022-03-08 ENCOUNTER — Telehealth: Payer: Self-pay | Admitting: Rheumatology

## 2022-03-08 ENCOUNTER — Other Ambulatory Visit: Payer: Self-pay | Admitting: *Deleted

## 2022-03-08 DIAGNOSIS — Z79899 Other long term (current) drug therapy: Secondary | ICD-10-CM

## 2022-03-08 NOTE — Progress Notes (Unsigned)
Office Visit Note  Patient: Joyce Zhang             Date of Birth: 1971/01/06           MRN: [id number]             PCP: Nunzio Cobbs, MD Referring: Aundria Rud* Visit Date: 03/19/2022 Occupation: @GUAROCC @  Subjective:  Medication monitoring   History of Present Illness: Joyce Zhang is a 51 y.o. female with history of vasculitis and episcleritis. She is taking methotrexate 5 tablets by mouth once weekly and folic acid daily.  She was advised to reduce the dose of methotrexate from 6 tablets weekly to 5 tablets weekly after lab work on 03/08/2022 revealed low white blood cell count.  She is planning on having updated lab work next month to recheck her CBC.  Patient reports that she has been working a different shift and has been out of her routine.  She states that she had a hiatus from her daily vitamins which she feels may be contributing.  She has restarted her daily vitamins.  She denies any new or worsening symptoms on the reduced dose of methotrexate.  She has not had any flares of vasculitis recently.  She denies any epi scleritis flares.  She is not experiencing eye pain, redness, or sensitivity of light at this time.  She denies any joint pain or joint swelling currently.  She denies any new medical conditions.  She has not had any recent or recurrent infections.    Activities of Daily Living:  Patient reports morning stiffness for 0 minutes.   Patient Denies nocturnal pain.  Difficulty dressing/grooming: Denies Difficulty climbing stairs: Denies Difficulty getting out of chair: Denies Difficulty using hands for taps, buttons, cutlery, and/or writing: Denies  Review of Systems  Constitutional:  Negative for fatigue.  HENT:  Negative for mouth sores and mouth dryness.   Eyes:  Negative for dryness.  Respiratory:  Negative for shortness of breath.   Cardiovascular:  Negative for chest pain and palpitations.  Gastrointestinal:   Negative for blood in stool, constipation and diarrhea.  Endocrine: Negative for increased urination.  Genitourinary:  Negative for involuntary urination.  Musculoskeletal:  Negative for joint pain, joint pain, joint swelling, myalgias, muscle weakness, morning stiffness, muscle tenderness and myalgias.  Skin:  Negative for color change, rash, hair loss and sensitivity to sunlight.  Allergic/Immunologic: Negative for susceptible to infections.  Neurological:  Negative for dizziness and headaches.  Hematological:  Negative for swollen glands.  Psychiatric/Behavioral:  Negative for depressed mood and sleep disturbance. The patient is not nervous/anxious.     PMFS History:  Patient Active Problem List   Diagnosis Date Noted   Leukocytoclastic vasculitis ([place]) 09/16/2016   Alcohol use 09/13/2016   Vasculitis on skin biopsy ([place]) 07/02/2016   Episcleritis of both eyes 07/02/2016   High risk medication use 07/02/2016   History of DVT (deep vein thrombosis) 07/02/2016    Past Medical History:  Diagnosis Date   Bone spur of left foot    Dysmenorrhea    Episcleritis of both eyes    Fibroid    H/O vasculitis    in legs   Scleritis     Family History  Adopted: Yes  Problem Relation Age of Onset   Diabetes Mother    Asthma Mother    Breast cancer Mother    Diabetes Maternal Grandmother    Asthma Maternal Grandmother    Diabetes Maternal  Grandfather    Past Surgical History:  Procedure Laterality Date   broken foot Right    fracture of index finger Left    Social History   Social History Narrative   Not on file   Immunization History  Administered Date(s) Administered   Moderna Sars-Covid-2 Vaccination 12/31/2019, 01/14/2020   Tdap 12/11/2021     Objective: Vital Signs: BP (!) 167/102 (BP Location: Right Arm, Patient Position: Sitting, Cuff Size: Large)   Pulse 80   Ht $R'5\' 8"'So$  (1.727 m)   Wt 256 lb 3.2 oz (116.2 kg)   BMI 38.96 kg/m    Physical Exam Vitals and  nursing note reviewed.  Constitutional:      Appearance: She is well-developed.  HENT:     Head: Normocephalic and atraumatic.  Eyes:     Conjunctiva/sclera: Conjunctivae normal.  Cardiovascular:     Rate and Rhythm: Normal rate and regular rhythm.     Heart sounds: Normal heart sounds.  Pulmonary:     Effort: Pulmonary effort is normal.     Breath sounds: Normal breath sounds.  Abdominal:     General: Bowel sounds are normal.     Palpations: Abdomen is soft.  Musculoskeletal:     Cervical back: Normal range of motion.  Skin:    General: Skin is warm and dry.     Capillary Refill: Capillary refill takes less than 2 seconds.     Comments: Post-inflammatory hyperpigmented lesions on bilateral lower extremities.   Neurological:     Mental Status: She is alert and oriented to person, place, and time.  Psychiatric:        Behavior: Behavior normal.      Musculoskeletal Exam: C-spine, thoracic spine, and lumbar spine good ROM.  Shoulder joints, elbow joints, wrist joints, MCPs, PIPs, and DIPs good ROM with no synovitis.  Complete fist formation bilaterally.  Hip joints, knee joints, and ankle joints have good Rom with no discomfort.  No warmth or effusion of knee joints.  No tenderness or swelling of ankle joints.   CDAI Exam: CDAI Score: -- Patient Global: --; Provider Global: -- Swollen: --; Tender: -- Joint Exam 03/19/2022   No joint exam has been documented for this visit   There is currently no information documented on the homunculus. Go to the Rheumatology activity and complete the homunculus joint exam.  Investigation: No additional findings.  Imaging: No results found.  Recent Labs: Lab Results  Component Value Date   WBC 3.5 (L) 03/08/2022   HGB 13.4 03/08/2022   PLT 174 03/08/2022   NA 139 03/08/2022   K 3.8 03/08/2022   CL 105 03/08/2022   CO2 21 03/08/2022   GLUCOSE 112 03/08/2022   BUN 17 03/08/2022   CREATININE 0.88 03/08/2022   BILITOT 0.2  03/08/2022   ALKPHOS 58 12/20/2019   AST 12 03/08/2022   ALT 11 03/08/2022   PROT 8.0 03/08/2022   ALBUMIN 4.2 12/20/2019   CALCIUM 9.5 03/08/2022   GFRAA 128 01/03/2020   QFTBGOLDPLUS NEGATIVE 05/18/2021    Speciality Comments: [id number], Ttd with MTXx 6 tabs weekly X  25yr.  Procedures:  No procedures performed Allergies: Patient has no known allergies.    Assessment / Plan:     Visit Diagnoses: Vasculitis on skin biopsy ([place]) - 07/04/16: ANCA-, ANA-, RF<14, anti-CCP<16, TSH 0.59, ESR 14, CK 200, HX of remission on methotrexate in the past: No signs or symptoms of active disease or a flare at this time.  She has  clinically been doing well taking methotrexate as prescribed.  She was found to have a low white blood cell count at 3.5 on 03/08/2022 at which time she was advised to reduce the dose of methotrexate from 6 tablets to 5 tablets weekly.  She has not noticed any new or worsening symptoms on the reduced dose of methotrexate.  She plans on returning for updated lab work next month. She continues to have some hyperpigmentation on bilateral lower extremities but no active lesions at this time.  She has not developed any other new or worsening symptoms since her last office visit.  She has no joint pain or synovitis at this time.  She has not had any muscular weakness.  No shortness of breath or pleuritic chest pain. She will remain on methotrexate 5 tablets by mouth once weekly.  She was advised to notify us if she develops signs or symptoms of a flare.  She will follow-up in the office in 5 months or sooner if needed.  High risk medication use - Methotrexate 5 tablets by mouth once weekly and folic acid 1 mg daily. CBC and CMP updated on 03/08/22.  White blood cell count was low at 3.5.  She was advised to reduce the dose of methotrexate from 6 tablets to 5 tablets weekly.  She will be returning for updated lab work in August and every 3 months to monitor for drug toxicity.  Standing orders  for CBC and CMP remain in place. She has not had any recent or recurrent infections.  Discussed the importance of holding methotrexate if she develops signs or symptoms of an infection and to resume once the infection has completely cleared.   Episcleritis of both eyes - Treated by Dr. Kathrin Penner at [facility] ophthalmology in the past.  She has not had any episodes since 2018.  No signs or symptoms of a flare.  No conjunctival injection noted.  Overall her episcleritis has been well controlled on methotrexate as prescribed.  She was advised to notify us if she develops any new or worsening symptoms.  Elevated CK - CK was mildly elevated in the past.  CK within normal limits: 38 on 05/18/2021.   Other medical conditions are listed as follows:   History of DVT (deep vein thrombosis) - 2012 after cast on her right leg.  Seasonal allergic rhinitis due to other allergic trigger  Alcohol use  Former smoker - She quit smoking in 2020.  Half a pack per day for 30 years.  Orders: No orders of the defined types were placed in this encounter.  No orders of the defined types were placed in this encounter.     Follow-Up Instructions: Return in about 5 months (around 08/19/2022) for Vasculitis, Episcleritis.   Ofilia Neas, PA-C  Note - This record has been created using Dragon software.  Chart creation errors have been sought, but may not always  have been located. Such creation errors do not reflect on  the standard of medical care.

## 2022-03-08 NOTE — Telephone Encounter (Signed)
Next Visit: 03/19/2022   Last Visit: 10/22/2021   Last Fill: 02/01/2022 (30 day supply)   DX: Vasculitis on skin biopsy   Current Dose per office note 10/22/2021:  Methotrexate 6 tablets by mouth once weekly   Labs: 10/22/2021 CBC stable.  Glucose is 106. Total protein is borderline elevated-8.2.  rest of CMP WNL.   Patient updated labs today 03/08/2022.    Okay to refill MTX?

## 2022-03-08 NOTE — Telephone Encounter (Signed)
Patient called requesting prescription refill of Methotrexate to be sent to [facility] at [address].

## 2022-03-09 LAB — CBC WITH DIFFERENTIAL/PLATELET
Absolute Monocytes: 179 cells/uL — ABNORMAL LOW (ref 200–950)
Basophils Absolute: 21 cells/uL (ref 0–200)
Basophils Relative: 0.6 %
Eosinophils Absolute: 81 cells/uL (ref 15–500)
Eosinophils Relative: 2.3 %
HCT: 41.2 % (ref 35.0–45.0)
Hemoglobin: 13.4 g/dL (ref 11.7–15.5)
Lymphs Abs: 1558 cells/uL (ref 850–3900)
MCH: 28.7 pg (ref 27.0–33.0)
MCHC: 32.5 g/dL (ref 32.0–36.0)
MCV: 88.2 fL (ref 80.0–100.0)
MPV: 10.9 fL (ref 7.5–12.5)
Monocytes Relative: 5.1 %
Neutro Abs: 1663 cells/uL (ref 1500–7800)
Neutrophils Relative %: 47.5 %
Platelets: 174 10*3/uL (ref 140–400)
RBC: 4.67 10*6/uL (ref 3.80–5.10)
RDW: 14.6 % (ref 11.0–15.0)
Total Lymphocyte: 44.5 %
WBC: 3.5 10*3/uL — ABNORMAL LOW (ref 3.8–10.8)

## 2022-03-09 LAB — COMPLETE METABOLIC PANEL WITH GFR
AG Ratio: 1.4 (calc) (ref 1.0–2.5)
ALT: 11 U/L (ref 6–29)
AST: 12 U/L (ref 10–35)
Albumin: 4.7 g/dL (ref 3.6–5.1)
Alkaline phosphatase (APISO): 62 U/L (ref 31–125)
BUN: 17 mg/dL (ref 7–25)
CO2: 21 mmol/L (ref 20–32)
Calcium: 9.5 mg/dL (ref 8.6–10.2)
Chloride: 105 mmol/L (ref 98–110)
Creat: 0.88 mg/dL (ref 0.50–0.99)
Globulin: 3.3 g/dL (calc) (ref 1.9–3.7)
Glucose, Bld: 112 mg/dL (ref 65–139)
Potassium: 3.8 mmol/L (ref 3.5–5.3)
Sodium: 139 mmol/L (ref 135–146)
Total Bilirubin: 0.2 mg/dL (ref 0.2–1.2)
Total Protein: 8 g/dL (ref 6.1–8.1)
eGFR: 81 mL/min/{1.73_m2} (ref >=60)

## 2022-03-11 ENCOUNTER — Telehealth: Payer: Self-pay | Admitting: *Deleted

## 2022-03-11 MED ORDER — METHOTREXATE 2.5 MG PO TABS
ORAL_TABLET | ORAL | 0 refills | Status: DC
Start: 2022-03-11 — End: 2022-11-04

## 2022-03-11 NOTE — Telephone Encounter (Signed)
WBC count was 3.5.  absolute monocytes are low.

## 2022-03-11 NOTE — Telephone Encounter (Signed)
WBC count is low-3.2.  Please advise patient to reduce methotrexate to 5 tablets weekly and recheck CBC with diff in 1 month.

## 2022-03-11 NOTE — Progress Notes (Signed)
WBC count is low-3.5. absolute monocytes are low.  Rest of CBC WNL. CMP WNL.   Please advise patient  to reduce MTX to 5 tablets once weekly.  Recheck CBC with diff in 1 month.

## 2022-03-11 NOTE — Telephone Encounter (Signed)
Please change prescription to 5 tablets once weekly and pend future order for CBC with diff

## 2022-03-11 NOTE — Telephone Encounter (Signed)
-----   Message from Ofilia Neas, PA-C sent at 03/11/2022  8:09 AM EDT ----- WBC count is low-3.5. absolute monocytes are low.  Rest of CBC WNL. CMP WNL.   Please advise patient  to reduce MTX to 5 tablets once weekly.  Recheck CBC with diff in 1 month.

## 2022-03-12 ENCOUNTER — Ambulatory Visit (INDEPENDENT_AMBULATORY_CARE_PROVIDER_SITE_OTHER): Payer: BC Managed Care – PPO

## 2022-03-12 DIAGNOSIS — Z3042 Encounter for surveillance of injectable contraceptive: Secondary | ICD-10-CM

## 2022-03-12 MED ORDER — MEDROXYPROGESTERONE ACETATE 150 MG/ML IM SUSP
150.0000 mg | Freq: Once | INTRAMUSCULAR | Status: AC
  Administered 2022-03-12: 150 mg via INTRAMUSCULAR

## 2022-03-12 NOTE — Progress Notes (Signed)
Depo Provera Injection given IM RUOQ.  Patient tolerated injection well.  Next injection is due 05/28/22-06/11/22.

## 2022-03-19 ENCOUNTER — Ambulatory Visit: Payer: BC Managed Care – PPO | Admitting: Physician Assistant

## 2022-03-19 ENCOUNTER — Encounter: Payer: Self-pay | Admitting: Physician Assistant

## 2022-03-19 VITALS — BP 167/102 | HR 80 | Ht 68.0 in | Wt 256.2 lb

## 2022-03-19 DIAGNOSIS — Z79899 Other long term (current) drug therapy: Secondary | ICD-10-CM | POA: Diagnosis not present

## 2022-03-19 DIAGNOSIS — Z87891 Personal history of nicotine dependence: Secondary | ICD-10-CM

## 2022-03-19 DIAGNOSIS — Z86718 Personal history of other venous thrombosis and embolism: Secondary | ICD-10-CM

## 2022-03-19 DIAGNOSIS — R748 Abnormal levels of other serum enzymes: Secondary | ICD-10-CM | POA: Diagnosis not present

## 2022-03-19 DIAGNOSIS — H15103 Unspecified episcleritis, bilateral: Secondary | ICD-10-CM

## 2022-03-19 DIAGNOSIS — J3089 Other allergic rhinitis: Secondary | ICD-10-CM

## 2022-03-19 DIAGNOSIS — I776 Arteritis, unspecified: Secondary | ICD-10-CM

## 2022-03-19 DIAGNOSIS — Z789 Other specified health status: Secondary | ICD-10-CM

## 2022-03-19 NOTE — Patient Instructions (Signed)
Standing Labs ?We placed an order today for your standing lab work.  ? ?Please have your standing labs drawn in August and every 3 months  ? ?If possible, please have your labs drawn 2 weeks prior to your appointment so that the provider can discuss your results at your appointment. ? ?Please note that you may see your imaging and lab results in MyChart before we have reviewed them. ?We may be awaiting multiple results to interpret others before contacting you. ?Please allow our office up to 72 hours to thoroughly review all of the results before contacting the office for clarification of your results. ? ?We have open lab daily: ?Monday through Thursday from 1:30-4:30 PM and Friday from 1:30-4:00 PM ?at the office of Dr. Shaili Deveshwar, [facility].   ?Please be advised, all patients with office appointments requiring lab work will take precedent over walk-in lab work.  ?If possible, please come for your lab work on Monday and Friday afternoons, as you may experience shorter wait times. ?The office is located at [address], [address], [place], [place] [address] ?No appointment is necessary.   ?Labs are drawn by [facility]. Please bring your co-pay at the time of your lab draw.  You may receive a bill from [facility] for your lab work. ? ?Please note if you are on Hydroxychloroquine and and an order has been placed for a Hydroxychloroquine level, you will need to have it drawn 4 hours or more after your last dose. ? ?If you wish to have your labs drawn at another location, please call the office 24 hours in advance to send orders. ? ?If you have any questions regarding directions or hours of operation,  ?please call [phone].   ?As a reminder, please drink plenty of water prior to coming for your lab work. Thanks! ? ?

## 2022-03-20 ENCOUNTER — Other Ambulatory Visit: Payer: Self-pay | Admitting: Obstetrics and Gynecology

## 2022-03-20 DIAGNOSIS — Z1231 Encounter for screening mammogram for malignant neoplasm of breast: Secondary | ICD-10-CM

## 2022-04-02 DIAGNOSIS — L218 Other seborrheic dermatitis: Secondary | ICD-10-CM | POA: Diagnosis not present

## 2022-04-02 DIAGNOSIS — L710 Perioral dermatitis: Secondary | ICD-10-CM | POA: Diagnosis not present

## 2022-04-05 ENCOUNTER — Ambulatory Visit: Payer: BC Managed Care – PPO

## 2022-04-18 ENCOUNTER — Ambulatory Visit
Admission: RE | Admit: 2022-04-18 | Discharge: 2022-04-18 | Disposition: A | Discharge status: Home / Self Care | Payer: BC Managed Care – PPO | Source: Ambulatory Visit | Attending: Obstetrics and Gynecology | Admitting: Obstetrics and Gynecology

## 2022-04-18 DIAGNOSIS — Z1231 Encounter for screening mammogram for malignant neoplasm of breast: Secondary | ICD-10-CM | POA: Diagnosis not present

## 2022-05-27 ENCOUNTER — Other Ambulatory Visit: Payer: Self-pay | Admitting: Physician Assistant

## 2022-05-27 NOTE — Telephone Encounter (Signed)
Next Visit: 08/29/2022  Last Visit: 03/19/2022  Last Fill: 05/24/2021  DX: Vasculitis on skin biopsy   Current Dose per office note on 3/95/8441: folic acid 1 mg daily.  Okay to refill folic acid?

## 2022-05-27 NOTE — Telephone Encounter (Signed)
Next Visit: 08/29/2022  Last Visit: 03/19/2022  Last Fill: 05/24/2021  Dx: Vasculitis on skin biopsy   Current Dose per office note on 4/72/0721: folic acid 1 mg daily.  Okay to refill folic acid?

## 2022-06-11 ENCOUNTER — Ambulatory Visit: Payer: BC Managed Care – PPO

## 2022-06-17 ENCOUNTER — Ambulatory Visit (INDEPENDENT_AMBULATORY_CARE_PROVIDER_SITE_OTHER): Payer: BC Managed Care – PPO

## 2022-06-17 DIAGNOSIS — Z3042 Encounter for surveillance of injectable contraceptive: Secondary | ICD-10-CM

## 2022-06-17 MED ORDER — MEDROXYPROGESTERONE ACETATE 150 MG/ML IM SUSP
150.0000 mg | Freq: Once | INTRAMUSCULAR | Status: AC
  Administered 2022-06-17: 150 mg via INTRAMUSCULAR

## 2022-06-17 NOTE — Progress Notes (Signed)
Patient is here for Depo Provera Injection Patient is within Depo Provera Calender Limits no patient has same sex partner does not require UPT  Next Depo Due between: 09/02/22-09/16/22 Last AEX: 12/11/21  Patient is aware when next depo is due  Pt tolerated Injection well. In RUOQ  Routed to provider for review, encounter closed.

## 2022-06-24 ENCOUNTER — Telehealth: Payer: Self-pay | Admitting: General Practice

## 2022-06-24 NOTE — Telephone Encounter (Signed)
Patient needs montelukast refilled - please send to [facility] - [address] [place]. [place], [place]  [address]  Next visit:  11/04/2022

## 2022-06-26 NOTE — Telephone Encounter (Signed)
Patient called back and needs her medication

## 2022-06-27 NOTE — Telephone Encounter (Signed)
Okay to refill montelukast.  Thanks.

## 2022-07-01 NOTE — Telephone Encounter (Signed)
Patient called back and I see where Dr. Mitchel Honour approved it on 11/02.  Patient's pharmacy has not received this refill.

## 2022-07-03 MED ORDER — MONTELUKAST SODIUM 10 MG PO TABS: 10.0000 mg | ORAL_TABLET | Freq: Every day | ORAL | 0 refills | Status: DC

## 2022-07-03 NOTE — Telephone Encounter (Signed)
Called patient to informed her that her rx was sent to pharmacy requested

## 2022-07-25 ENCOUNTER — Other Ambulatory Visit: Payer: Self-pay | Admitting: *Deleted

## 2022-07-25 MED ORDER — METHOTREXATE SODIUM 2.5 MG PO TABS: 12.5000 mg | ORAL_TABLET | ORAL | 0 refills | Status: DC

## 2022-07-25 NOTE — Telephone Encounter (Signed)
Next Visit: 08/29/2022  Last Visit: 03/19/2022  Last Fill: 03/11/2022  DX: Vasculitis on skin biopsy   Current Dose per office note  03/19/2022 : Methotrexate 5 tablets by mouth once weekly  Labs: 03/08/2022, WBC count is low-3.5. absolute monocytes are low.  Rest of CBC WNL. CMP WNL.   Please advise patient  to reduce MTX to 5 tablets once weekly.  Recheck CBC with diff in 1 month.  LMOM for patient to have labs drawn.  Okay to refill MTX?

## 2022-08-15 NOTE — Progress Notes (Deleted)
Office Visit Note  Patient: Joyce Zhang             Date of Birth: 12-24-1970           MRN: [id number]             PCP: Horald Pollen, MD Referring: Aundria Rud* Visit Date: 08/29/2022 Occupation: '@GUAROCC'$ @  Subjective:  No chief complaint on file.   History of Present Illness: Joyce Zhang is a 51 y.o. female ***     Activities of Daily Living:  Patient reports morning stiffness for *** {minute/hour:19697}.   Patient {ACTIONS;DENIES/REPORTS:21021675::"Denies"} nocturnal pain.  Difficulty dressing/grooming: {ACTIONS;DENIES/REPORTS:21021675::"Denies"} Difficulty climbing stairs: {ACTIONS;DENIES/REPORTS:21021675::"Denies"} Difficulty getting out of chair: {ACTIONS;DENIES/REPORTS:21021675::"Denies"} Difficulty using hands for taps, buttons, cutlery, and/or writing: {ACTIONS;DENIES/REPORTS:21021675::"Denies"}  No Rheumatology ROS completed.   PMFS History:  Patient Active Problem List   Diagnosis Date Noted   Leukocytoclastic vasculitis ([place]) 09/16/2016   Alcohol use 09/13/2016   Vasculitis on skin biopsy ([place]) 07/02/2016   Episcleritis of both eyes 07/02/2016   High risk medication use 07/02/2016   History of DVT (deep vein thrombosis) 07/02/2016    Past Medical History:  Diagnosis Date   Bone spur of left foot    DVT (deep venous thrombosis) ([place]) 2012   following casting of leg, noted on chart review   Dysmenorrhea    Episcleritis of both eyes    Fibroid    H/O vasculitis    in legs   Scleritis     Family History  Adopted: Yes  Problem Relation Age of Onset   Diabetes Mother    Asthma Mother    Breast cancer Mother    Diabetes Maternal Grandmother    Asthma Maternal Grandmother    Diabetes Maternal Grandfather    Past Surgical History:  Procedure Laterality Date   broken foot Right    fracture of index finger Left    Social History   Social History Narrative   Not on file   Immunization History   Administered Date(s) Administered   Moderna Sars-Covid-2 Vaccination 12/31/2019, 01/14/2020   Tdap 12/11/2021     Objective: Vital Signs: There were no vitals taken for this visit.   Physical Exam   Musculoskeletal Exam: ***  CDAI Exam: CDAI Score: -- Patient Global: --; Provider Global: -- Swollen: --; Tender: -- Joint Exam 08/29/2022   No joint exam has been documented for this visit   There is currently no information documented on the homunculus. Go to the Rheumatology activity and complete the homunculus joint exam.  Investigation: No additional findings.  Imaging: No results found.  Recent Labs: Lab Results  Component Value Date   WBC 3.5 (L) 03/08/2022   HGB 13.4 03/08/2022   PLT 174 03/08/2022   NA 139 03/08/2022   K 3.8 03/08/2022   CL 105 03/08/2022   CO2 21 03/08/2022   GLUCOSE 112 03/08/2022   BUN 17 03/08/2022   CREATININE 0.88 03/08/2022   BILITOT 0.2 03/08/2022   ALKPHOS 58 12/20/2019   AST 12 03/08/2022   ALT 11 03/08/2022   PROT 8.0 03/08/2022   ALBUMIN 4.2 12/20/2019   CALCIUM 9.5 03/08/2022   GFRAA 128 01/03/2020   QFTBGOLDPLUS NEGATIVE 05/18/2021    Speciality Comments: [id number], Ttd with MTXx 6 tabs weekly X  109yr  Procedures:  No procedures performed Allergies: Patient has no known allergies.   Assessment / Plan:     Visit Diagnoses: No diagnosis found.  Orders: No orders of the  defined types were placed in this encounter.  No orders of the defined types were placed in this encounter.   Face-to-face time spent with patient was *** minutes. Greater than 50% of time was spent in counseling and coordination of care.  Follow-Up Instructions: No follow-ups on file.   Earnestine Mealing, CMA  Note - This record has been created using Editor, commissioning.  Chart creation errors have been sought, but may not always  have been located. Such creation errors do not reflect on  the standard of medical care.

## 2022-08-29 ENCOUNTER — Ambulatory Visit: Payer: BC Managed Care – PPO | Admitting: Rheumatology

## 2022-08-29 DIAGNOSIS — Z789 Other specified health status: Secondary | ICD-10-CM

## 2022-08-29 DIAGNOSIS — Z87891 Personal history of nicotine dependence: Secondary | ICD-10-CM

## 2022-08-29 DIAGNOSIS — Z86718 Personal history of other venous thrombosis and embolism: Secondary | ICD-10-CM

## 2022-08-29 DIAGNOSIS — H15103 Unspecified episcleritis, bilateral: Secondary | ICD-10-CM

## 2022-08-29 DIAGNOSIS — I776 Arteritis, unspecified: Secondary | ICD-10-CM

## 2022-08-29 DIAGNOSIS — Z79899 Other long term (current) drug therapy: Secondary | ICD-10-CM

## 2022-08-29 DIAGNOSIS — J3089 Other allergic rhinitis: Secondary | ICD-10-CM

## 2022-08-29 DIAGNOSIS — R748 Abnormal levels of other serum enzymes: Secondary | ICD-10-CM

## 2022-09-05 NOTE — Progress Notes (Unsigned)
Office Visit Note  Patient: Joyce Zhang             Date of Birth: Nov 05, 1970           MRN: [id number]             PCP: Horald Pollen, MD Referring: Aundria Rud* Visit Date: 09/18/2022 Occupation: '@GUAROCC'$ @  Subjective:  Medication monitoring   History of Present Illness: Joyce Zhang is a 52 y.o. female with history of vasculitis and episcleritis.  Patient is prescribed methotrexate 5 tablets by mouth once weekly and folic acid 1 mg daily.  She has continued to tolerate methotrexate without any side effects.  She denies any recent or recurrent infections.  Patient reports that she has been out of methotrexate for the past 1 month due to requiring a follow-up visit and updated lab work.  Patient reports about 2 weeks ago she started to have pain and redness in the right eye and used 1 drop of prednisolone which resolved her symptoms.  She has not had any recurrence of symptoms of scleritis.  She denies any signs or symptoms of a vasculitis flare.  She has no rashes currently.  She denies any joint pain or joint swelling at this time.  She denies any new concerns.  She continues to find methotrexate to be effective at managing vasculitis and episcleritis. She denies any new medical conditions.   Activities of Daily Living:  Patient reports morning stiffness for 0 minutes.   Patient Denies nocturnal pain.  Difficulty dressing/grooming: Denies Difficulty climbing stairs: Denies Difficulty getting out of chair: Denies Difficulty using hands for taps, buttons, cutlery, and/or writing: Denies  Review of Systems  Constitutional:  Negative for fatigue.  HENT:  Negative for mouth sores and mouth dryness.   Eyes:  Negative for dryness.  Respiratory:  Negative for shortness of breath.   Cardiovascular:  Negative for chest pain and palpitations.  Gastrointestinal:  Negative for blood in stool, constipation and diarrhea.  Endocrine: Negative for  increased urination.  Genitourinary:  Negative for involuntary urination.  Musculoskeletal:  Negative for joint pain, gait problem, joint pain, joint swelling, myalgias, muscle weakness, morning stiffness, muscle tenderness and myalgias.  Skin:  Negative for color change, rash, hair loss and sensitivity to sunlight.  Allergic/Immunologic: Negative for susceptible to infections.  Neurological:  Negative for dizziness and headaches.  Hematological:  Negative for swollen glands.  Psychiatric/Behavioral:  Negative for depressed mood and sleep disturbance. The patient is not nervous/anxious.     PMFS History:  Patient Active Problem List   Diagnosis Date Noted   Leukocytoclastic vasculitis ([place]) 09/16/2016   Alcohol use 09/13/2016   Vasculitis on skin biopsy ([place]) 07/02/2016   Episcleritis of both eyes 07/02/2016   High risk medication use 07/02/2016   History of DVT (deep vein thrombosis) 07/02/2016    Past Medical History:  Diagnosis Date   Bone spur of left foot    DVT (deep venous thrombosis) ([place]) 2012   following casting of leg, noted on chart review   Dysmenorrhea    Episcleritis of both eyes    Fibroid    H/O vasculitis    in legs   Scleritis     Family History  Adopted: Yes  Problem Relation Age of Onset   Diabetes Mother    Asthma Mother    Breast cancer Mother    Diabetes Maternal Grandmother    Asthma Maternal Grandmother    Diabetes Maternal Grandfather  Past Surgical History:  Procedure Laterality Date   broken foot Right    fracture of index finger Left    Social History   Social History Narrative   Not on file   Immunization History  Administered Date(s) Administered   Moderna Sars-Covid-2 Vaccination 12/31/2019, 01/14/2020   Tdap 12/11/2021     Objective: Vital Signs: BP 133/84 (BP Location: Left Arm, Patient Position: Sitting, Cuff Size: Large)   Pulse 88   Resp 17   Ht '5\' 8"'$  (1.727 m)   Wt 234 lb 6.4 oz (106.3 kg)   BMI 35.64 kg/m     Physical Exam Vitals and nursing note reviewed.  Constitutional:      Appearance: She is well-developed.  HENT:     Head: Normocephalic and atraumatic.  Eyes:     Conjunctiva/sclera: Conjunctivae normal.  Cardiovascular:     Rate and Rhythm: Normal rate and regular rhythm.     Heart sounds: Normal heart sounds.  Pulmonary:     Effort: Pulmonary effort is normal.     Breath sounds: Normal breath sounds.  Abdominal:     General: Bowel sounds are normal.     Palpations: Abdomen is soft.  Musculoskeletal:     Cervical back: Normal range of motion.  Skin:    General: Skin is warm and dry.     Capillary Refill: Capillary refill takes less than 2 seconds.  Neurological:     Mental Status: She is alert and oriented to person, place, and time.  Psychiatric:        Behavior: Behavior normal.      Musculoskeletal Exam: C-spine, thoracic spine, lumbar spine have good range of motion.  Shoulder joints, elbow joints, wrist joints, MCPs, PIPs, DIPs have good range of motion with no synovitis.  Full strength bilateral upper extremities.  Complete fist formation bilaterally.  Hip joints have good range of motion with no groin pain.  Knee joints have good range of motion with no warmth or effusion.  Ankle joints have good range of motion with no tenderness or joint swelling.  CDAI Exam: CDAI Score: -- Patient Global: --; Provider Global: -- Swollen: --; Tender: -- Joint Exam 09/18/2022   No joint exam has been documented for this visit   There is currently no information documented on the homunculus. Go to the Rheumatology activity and complete the homunculus joint exam.  Investigation: No additional findings.  Imaging: No results found.  Recent Labs: Lab Results  Component Value Date   WBC 3.5 (L) 03/08/2022   HGB 13.4 03/08/2022   PLT 174 03/08/2022   NA 139 03/08/2022   K 3.8 03/08/2022   CL 105 03/08/2022   CO2 21 03/08/2022   GLUCOSE 112 03/08/2022   BUN 17 03/08/2022    CREATININE 0.88 03/08/2022   BILITOT 0.2 03/08/2022   ALKPHOS 58 12/20/2019   AST 12 03/08/2022   ALT 11 03/08/2022   PROT 8.0 03/08/2022   ALBUMIN 4.2 12/20/2019   CALCIUM 9.5 03/08/2022   GFRAA 128 01/03/2020   QFTBGOLDPLUS NEGATIVE 05/18/2021    Speciality Comments: [id number], Ttd with MTXx 6 tabs weekly X  57yr  Procedures:  No procedures performed Allergies: Patient has no known allergies.   Assessment / Plan:     Visit Diagnoses: [place] 07[place]16: ANCA-, ANA-, RF<14, anti-CCP<16, TSH 0.59, ESR 14, CK 200, HX of remission on methotrexate in the past: She has not had any signs or symptoms of a vasculitis flare.  She has clinically been doing well taking methotrexate 5 tablets by mouth once weekly along with folic acid 1 mg daily.  She has been out of her prescription for methotrexate for the past 1 month.  She has not noticed any recurrence of symptoms while off of methotrexate.  Updated lab work was obtained today and a refill of methotrexate was sent to the pharmacy.  She has not been experiencing any new or worsening pulmonary symptoms.  She has not had any signs of inflammatory arthritis.  No muscular weakness.  She was advised to notify us if she develops signs or symptoms of a vasculitis flare.  She will follow up in 5 months or sooner if needed.    High risk medication use - Methotrexate 5 tablets by mouth once weekly and folic acid 1 mg daily.  CBC and CMP drawn on 03/08/22.  Orders for CBC and CMP released today.   No recent or recurrent infections.  Discussed the importance of holding methotrexate if she develops signs or symptoms of an infection and to resume once the infection has completely cleared.  - Plan: CBC with Differential/Platelet, COMPLETE METABOLIC PANEL WITH GFR No new medical conditions.    Episcleritis of both eyes - Treated by Dr. Kathrin Penner at [facility] ophthalmology.  Follows up with ophthalmology on a yearly basis.  Her  episcleritis has remained well-controlled while taking methotrexate 5 tablets once weekly.  She has been off of methotrexate for the past 1 month and about 2 weeks ago she woke up with pain and redness in the right eye.  She used 1 drop of prednisolone eyedrops which resolved her symptoms.  She has not had any recurrence.  A refill of methotrexate was sent to the pharmacy today.  She was advised to notify us if she starts having more recurrent flares of episcleritis.  Elevated CK - CK was mildly elevated in the past.  CK within normal limits: 38 on 05/18/2021. CK will be rechecked today.  No muscular weakness- Plan: CK  Other medical conditions are listed as follows:   History of DVT (deep vein thrombosis) - 2012 after cast on her right leg.  Seasonal allergic rhinitis due to other allergic trigger  Alcohol use  Former smoker - She quit smoking in 2020.  Half a pack per day for 30 years.  Orders: Orders Placed This Encounter  Procedures   CBC with Differential/Platelet   COMPLETE METABOLIC PANEL WITH GFR   CK   Meds ordered this encounter  Medications   methotrexate (RHEUMATREX) 2.5 MG tablet    Sig: Take 5 tablets (12.5 mg total) by mouth once a week. Caution:Chemotherapy. Protect from light.    Dispense:  60 tablet    Refill:  0    Follow-Up Instructions: Return in about 5 months (around 02/17/2023) for Vasculitis, Episcleritis.   Ofilia Neas, PA-C  Note - This record has been created using Dragon software.  Chart creation errors have been sought, but may not always  have been located. Such creation errors do not reflect on  the standard of medical care.

## 2022-09-16 ENCOUNTER — Ambulatory Visit: Payer: BC Managed Care – PPO

## 2022-09-18 ENCOUNTER — Encounter: Payer: Self-pay | Admitting: Physician Assistant

## 2022-09-18 ENCOUNTER — Ambulatory Visit: Payer: Managed Care, Other (non HMO) | Attending: Rheumatology | Admitting: Physician Assistant

## 2022-09-18 ENCOUNTER — Ambulatory Visit: Payer: Managed Care, Other (non HMO)

## 2022-09-18 VITALS — BP 133/84 | HR 88 | Resp 17 | Ht 68.0 in | Wt 234.4 lb

## 2022-09-18 DIAGNOSIS — I776 Arteritis, unspecified: Secondary | ICD-10-CM

## 2022-09-18 DIAGNOSIS — H15103 Unspecified episcleritis, bilateral: Secondary | ICD-10-CM

## 2022-09-18 DIAGNOSIS — Z87891 Personal history of nicotine dependence: Secondary | ICD-10-CM

## 2022-09-18 DIAGNOSIS — Z86718 Personal history of other venous thrombosis and embolism: Secondary | ICD-10-CM

## 2022-09-18 DIAGNOSIS — R748 Abnormal levels of other serum enzymes: Secondary | ICD-10-CM

## 2022-09-18 DIAGNOSIS — Z789 Other specified health status: Secondary | ICD-10-CM

## 2022-09-18 DIAGNOSIS — Z79899 Other long term (current) drug therapy: Secondary | ICD-10-CM

## 2022-09-18 DIAGNOSIS — J3089 Other allergic rhinitis: Secondary | ICD-10-CM

## 2022-09-18 MED ORDER — METHOTREXATE SODIUM 2.5 MG PO TABS: 12.5000 mg | ORAL_TABLET | ORAL | 0 refills | Status: DC

## 2022-09-18 NOTE — Patient Instructions (Signed)
Standing Labs We placed an order today for your standing lab work.   Please have your standing labs drawn in April and every 3 months   Please have your labs drawn 2 weeks prior to your appointment so that the provider can discuss your lab results at your appointment.  Please note that you may see your imaging and lab results in MyChart before we have reviewed them. We will contact you once all results are reviewed. Please allow our office up to 72 hours to thoroughly review all of the results before contacting the office for clarification of your results.  Lab hours are:   Monday through Thursday from 8:00 am -12:30 pm and 1:00 pm-5:00 pm and Friday from 8:00 am-12:00 pm.  Please be advised, all patients with office appointments requiring lab work will take precedent over walk-in lab work.   Labs are drawn by [facility]. Please bring your co-pay at the time of your lab draw.  You may receive a bill from [facility] for your lab work.  Please note if you are on Hydroxychloroquine and and an order has been placed for a Hydroxychloroquine level, you will need to have it drawn 4 hours or more after your last dose.  If you wish to have your labs drawn at another location, please call the office 24 hours in advance so we can fax the orders.  The office is located at [address], [address], [place], [place] [address] No appointment is necessary.    If you have any questions regarding directions or hours of operation,  please call [phone].   As a reminder, please drink plenty of water prior to coming for your lab work. Thanks!  

## 2022-09-19 ENCOUNTER — Other Ambulatory Visit: Payer: Self-pay

## 2022-09-19 DIAGNOSIS — Z79899 Other long term (current) drug therapy: Secondary | ICD-10-CM

## 2022-09-19 LAB — CK: Total CK: 38 U/L (ref 29–143)

## 2022-09-19 LAB — COMPLETE METABOLIC PANEL WITH GFR
AG Ratio: 1.4 (calc) (ref 1.0–2.5)
ALT: 14 U/L (ref 6–29)
AST: 14 U/L (ref 10–35)
Albumin: 4.3 g/dL (ref 3.6–5.1)
Alkaline phosphatase (APISO): 56 U/L (ref 37–153)
BUN: 10 mg/dL (ref 7–25)
CO2: 27 mmol/L (ref 20–32)
Calcium: 9 mg/dL (ref 8.6–10.4)
Chloride: 104 mmol/L (ref 98–110)
Creat: 0.71 mg/dL (ref 0.50–1.03)
Globulin: 3 g/dL (calc) (ref 1.9–3.7)
Glucose, Bld: 113 mg/dL — ABNORMAL HIGH (ref 65–99)
Potassium: 3.9 mmol/L (ref 3.5–5.3)
Sodium: 140 mmol/L (ref 135–146)
Total Bilirubin: 0.3 mg/dL (ref 0.2–1.2)
Total Protein: 7.3 g/dL (ref 6.1–8.1)
eGFR: 104 mL/min/{1.73_m2} (ref >=60)

## 2022-09-19 LAB — CBC WITH DIFFERENTIAL/PLATELET
Absolute Monocytes: 78 cells/uL — ABNORMAL LOW (ref 200–950)
Basophils Absolute: 20 cells/uL (ref 0–200)
Basophils Relative: 0.7 %
Eosinophils Absolute: 120 cells/uL (ref 15–500)
Eosinophils Relative: 4.3 %
HCT: 35.7 % (ref 35.0–45.0)
Hemoglobin: 12 g/dL (ref 11.7–15.5)
Lymphs Abs: 1344 cells/uL (ref 850–3900)
MCH: 29.2 pg (ref 27.0–33.0)
MCHC: 33.6 g/dL (ref 32.0–36.0)
MCV: 86.9 fL (ref 80.0–100.0)
MPV: 11.1 fL (ref 7.5–12.5)
Monocytes Relative: 2.8 %
Neutro Abs: 1238 cells/uL — ABNORMAL LOW (ref 1500–7800)
Neutrophils Relative %: 44.2 %
Platelets: 147 10*3/uL (ref 140–400)
RBC: 4.11 10*6/uL (ref 3.80–5.10)
RDW: 13.4 % (ref 11.0–15.0)
Total Lymphocyte: 48 %
WBC: 2.8 10*3/uL — ABNORMAL LOW (ref 3.8–10.8)

## 2022-09-19 NOTE — Progress Notes (Signed)
CK WNL.  Glucose is 113. Rest of CMP WNL.  WBC count is low-2.8. absolute neutrophils and monocytes are elevated.   Recheck CBC with diff in 1 month.

## 2022-09-29 ENCOUNTER — Other Ambulatory Visit: Payer: Self-pay | Admitting: Emergency Medicine

## 2022-11-04 ENCOUNTER — Encounter: Payer: Self-pay | Admitting: Emergency Medicine

## 2022-11-04 ENCOUNTER — Ambulatory Visit (INDEPENDENT_AMBULATORY_CARE_PROVIDER_SITE_OTHER): Payer: Managed Care, Other (non HMO) | Admitting: Emergency Medicine

## 2022-11-04 VITALS — BP 132/84 | HR 85 | Temp 97.9°F | Ht 68.0 in | Wt 223.5 lb

## 2022-11-04 DIAGNOSIS — R1901 Right upper quadrant abdominal swelling, mass and lump: Secondary | ICD-10-CM | POA: Diagnosis not present

## 2022-11-04 DIAGNOSIS — Z130 Encounter for screening for diseases of the blood and blood-forming organs and certain disorders involving the immune mechanism: Secondary | ICD-10-CM | POA: Diagnosis not present

## 2022-11-04 DIAGNOSIS — Z13220 Encounter for screening for lipoid disorders: Secondary | ICD-10-CM | POA: Diagnosis not present

## 2022-11-04 DIAGNOSIS — Z0000 Encounter for general adult medical examination without abnormal findings: Secondary | ICD-10-CM | POA: Diagnosis not present

## 2022-11-04 DIAGNOSIS — Z13228 Encounter for screening for other metabolic disorders: Secondary | ICD-10-CM

## 2022-11-04 DIAGNOSIS — Z1211 Encounter for screening for malignant neoplasm of colon: Secondary | ICD-10-CM | POA: Diagnosis not present

## 2022-11-04 DIAGNOSIS — Z1329 Encounter for screening for other suspected endocrine disorder: Secondary | ICD-10-CM

## 2022-11-04 LAB — COMPREHENSIVE METABOLIC PANEL
ALT: 17 U/L (ref 0–35)
AST: 15 U/L (ref 0–37)
Albumin: 4.3 g/dL (ref 3.5–5.2)
Alkaline Phosphatase: 61 U/L (ref 39–117)
BUN: 14 mg/dL (ref 6–23)
CO2: 25 mEq/L (ref 19–32)
Calcium: 10.2 mg/dL (ref 8.4–10.5)
Chloride: 104 mEq/L (ref 96–112)
Creatinine, Ser: 0.84 mg/dL (ref 0.40–1.20)
GFR: 81.01 mL/min (ref >60.00)
Glucose, Bld: 104 mg/dL — ABNORMAL HIGH (ref 70–99)
Potassium: 4.1 mEq/L (ref 3.5–5.1)
Sodium: 139 mEq/L (ref 135–145)
Total Bilirubin: 0.5 mg/dL (ref 0.2–1.2)
Total Protein: 8.3 g/dL (ref 6.0–8.3)

## 2022-11-04 LAB — CBC WITH DIFFERENTIAL/PLATELET
Basophils Absolute: 0 10*3/uL (ref 0.0–0.1)
Basophils Relative: 0.8 % (ref 0.0–3.0)
Eosinophils Absolute: 0.2 10*3/uL (ref 0.0–0.7)
Eosinophils Relative: 5.7 % — ABNORMAL HIGH (ref 0.0–5.0)
HCT: 39.6 % (ref 36.0–46.0)
Hemoglobin: 13.4 g/dL (ref 12.0–15.0)
Lymphocytes Relative: 31.4 % (ref 12.0–46.0)
Lymphs Abs: 1 10*3/uL (ref 0.7–4.0)
MCHC: 33.9 g/dL (ref 30.0–36.0)
MCV: 86.7 fl (ref 78.0–100.0)
Monocytes Absolute: 0.1 10*3/uL (ref 0.1–1.0)
Monocytes Relative: 4.7 % (ref 3.0–12.0)
Neutro Abs: 1.8 10*3/uL (ref 1.4–7.7)
Neutrophils Relative %: 57.4 % (ref 43.0–77.0)
Platelets: 183 10*3/uL (ref 150.0–400.0)
RBC: 4.57 Mil/uL (ref 3.87–5.11)
RDW: 14.6 % (ref 11.5–15.5)
WBC: 3.1 10*3/uL — ABNORMAL LOW (ref 4.0–10.5)

## 2022-11-04 LAB — LIPID PANEL
Cholesterol: 166 mg/dL (ref 0–200)
HDL: 53.7 mg/dL (ref >39.00)
LDL Cholesterol: 96 mg/dL (ref 0–99)
NonHDL: 111.99
Total CHOL/HDL Ratio: 3
Triglycerides: 78 mg/dL (ref 0.0–149.0)
VLDL: 15.6 mg/dL (ref 0.0–40.0)

## 2022-11-04 LAB — HEMOGLOBIN A1C: Hgb A1c MFr Bld: 6 % (ref 4.6–6.5)

## 2022-11-04 NOTE — Patient Instructions (Signed)

## 2022-11-04 NOTE — Progress Notes (Signed)
WBC count remains low but has improved.  We will continue to monitor lab work closely.

## 2022-11-04 NOTE — Progress Notes (Signed)
Joyce Zhang [place] 52 y.o.   Chief Complaint  Patient presents with   Annual Exam    Patient upper abd she states she feels " a knot"  unsure if it is a hernia     HISTORY OF PRESENT ILLNESS: This is a 52 y.o. female here for annual exam. Sometimes feels a "lump" in right upper quadrant.  Not painful History of vasculitis.  Stable No other complaints or medical concerns today.  HPI   Prior to Admission medications   Medication Sig Start Date End Date Taking? Authorizing Provider  B Complex Vitamins (B COMPLEX PO) Take by mouth daily.    [provider]  BLACK COHOSH PO Take by mouth daily.    [provider]  cetirizine (ZYRTEC ALLERGY) 10 MG tablet Take 1 tablet (10 mg total) by mouth daily. 01/24/21   Minette Brine, FNP  folic acid (FOLVITE) 1 MG tablet TAKE 1 TABLET(1 MG) BY MOUTH DAILY 05/27/22   Ofilia Neas, PA-C  methotrexate (RHEUMATREX) 2.5 MG tablet TAKE 5 TABLETS BY MOUTH EVERY WEEK FOR CHEMOTHERAPY( PROTECT FROM LIGHT) 03/11/22   Ofilia Neas, PA-C  methotrexate (RHEUMATREX) 2.5 MG tablet Take 5 tablets (12.5 mg total) by mouth once a week. Caution:Chemotherapy. Protect from light. 09/18/22   Ofilia Neas, PA-C  montelukast (SINGULAIR) 10 MG tablet TAKE 1 TABLET(10 MG) BY MOUTH DAILY 09/29/22   Horald Pollen, MD  Multiple Vitamins-Minerals (ONE-A-DAY WOMENS PO) Take by mouth daily.    [provider]  nystatin (MYCOSTATIN/NYSTOP) powder Apply 1 application. topically 3 (three) times daily. Apply to affected area for up to 7 days 12/11/21   Nunzio Cobbs, MD  [place] moss    [provider]  TURMERIC PO Take by mouth daily.    [provider]  VITAMIN D PO Take by mouth daily.    [provider]    No Known Allergies  Patient Active Problem List   Diagnosis Date Noted   Leukocytoclastic vasculitis ([place]) 09/16/2016   Alcohol use 09/13/2016   Vasculitis on skin biopsy  ([place]) 07/02/2016   Episcleritis of both eyes 07/02/2016   High risk medication use 07/02/2016   History of DVT (deep vein thrombosis) 07/02/2016    Past Medical History:  Diagnosis Date   Bone spur of left foot    DVT (deep venous thrombosis) ([place]) 2012   following casting of leg, noted on chart review   Dysmenorrhea    Episcleritis of both eyes    Fibroid    H/O vasculitis    in legs   Scleritis     Past Surgical History:  Procedure Laterality Date   broken foot Right    fracture of index finger Left     Social History   Socioeconomic History   Marital status: Single    Spouse name: Not on file   Number of children: Not on file   Years of education: Not on file   Highest education level: Not on file  Occupational History   Not on file  Tobacco Use   Smoking status: Former    Packs/day: 0.25    Types: Cigarettes    Quit date: 10/25/2018    Years since quitting: 4.0    Passive exposure: Past   Smokeless tobacco: Never  Vaping Use   Vaping Use: Never used  Substance and Sexual Activity   Alcohol use: Yes    Alcohol/week: 3.0 standard drinks of alcohol  Types: 3 Standard drinks or equivalent per week    Comment: socially   Drug use: No   Sexual activity: Yes    Partners: Female    Birth control/protection: None  Other Topics Concern   Not on file  Social History Narrative   Not on file   Social Determinants of Health   Financial Resource Strain: Not on file  Food Insecurity: Not on file  Transportation Needs: Not on file  Physical Activity: Not on file  Stress: Not on file  Social Connections: Not on file  Intimate Partner Violence: Not on file    Family History  Adopted: Yes  Problem Relation Age of Onset   Diabetes Mother    Asthma Mother    Breast cancer Mother    Diabetes Maternal Grandmother    Asthma Maternal Grandmother    Diabetes Maternal Grandfather      Review of Systems  Constitutional: Negative.  Negative for chills and  fever.  HENT: Negative.  Negative for congestion and sore throat.   Respiratory: Negative.  Negative for cough and shortness of breath.   Cardiovascular: Negative.  Negative for chest pain and palpitations.  Gastrointestinal:  Negative for abdominal pain, diarrhea, nausea and vomiting.  Genitourinary: Negative.  Negative for dysuria and hematuria.  Skin: Negative.  Negative for rash.  Neurological: Negative.  Negative for dizziness and headaches.  All other systems reviewed and are negative.  Today's Vitals   11/04/22 0806  BP: 132/84  Pulse: 85  Temp: 97.9 F (36.6 C)  TempSrc: Oral  SpO2: 98%  Weight: 223 lb 8 oz (101.4 kg)  Height: '5\' 8"'$  (1.727 m)   Body mass index is 33.98 kg/m.   Physical Exam Vitals reviewed.  Constitutional:      Appearance: Normal appearance.  HENT:     Head: Normocephalic.     Mouth/Throat:     Mouth: Mucous membranes are moist.     Pharynx: Oropharynx is clear.  Eyes:     Extraocular Movements: Extraocular movements intact.     Pupils: Pupils are equal, round, and reactive to light.  Cardiovascular:     Rate and Rhythm: Normal rate and regular rhythm.  Pulmonary:     Effort: Pulmonary effort is normal.     Breath sounds: Normal breath sounds.  Abdominal:     General: There is no distension.     Palpations: Abdomen is soft. There is no mass.     Tenderness: There is no abdominal tenderness. There is no guarding.  Musculoskeletal:        General: Normal range of motion.     Cervical back: Neck supple. No tenderness.  Lymphadenopathy:     Cervical: No cervical adenopathy.  Skin:    General: Skin is warm and dry.     Capillary Refill: Capillary refill takes less than 2 seconds.  Neurological:     General: No focal deficit present.     Mental Status: She is alert and oriented to person, place, and time.  Psychiatric:        Mood and Affect: Mood normal.        Behavior: Behavior normal.      ASSESSMENT & PLAN: Problem List Items  Addressed This Visit   None Visit Diagnoses     Routine general medical examination at a health care facility    -  Primary   Relevant Orders   CBC with Differential   Comprehensive metabolic panel   Hemoglobin A1c  Lipid panel   Right upper quadrant abdominal mass       Relevant Orders   US Abdomen Limited RUQ (LIVER/GB)   Colon cancer screening       Relevant Orders   Ambulatory referral to Gastroenterology   Screening for deficiency anemia       Relevant Orders   CBC with Differential   Screening for lipoid disorders       Relevant Orders   Lipid panel   Screening for endocrine, metabolic and immunity disorder       Relevant Orders   Comprehensive metabolic panel   Hemoglobin A1c      Modifiable risk factors discussed with patient. Anticipatory guidance according to age provided. The following topics were also discussed: Social Determinants of Health Smoking.  Non-smoker Diet and nutrition and need to decrease amount of daily carbohydrate intake and daily calories and increase amount of plant-based protein in her diet Benefits of exercise Cancer screening and need for colon cancer screening with colonoscopy Vaccinations reviewed recommendations Cardiovascular risk assessment and need for blood work Mental health including depression and anxiety Fall and accident prevention  Patient Instructions  Health Maintenance, Female Adopting a healthy lifestyle and getting preventive care are important in promoting health and wellness. Ask your health care provider about: The right schedule for you to have regular tests and exams. Things you can do on your own to prevent diseases and keep yourself healthy. What should I know about diet, weight, and exercise? Eat a healthy diet  Eat a diet that includes plenty of vegetables, fruits, low-fat dairy products, and lean protein. Do not eat a lot of foods that are high in solid fats, added sugars, or sodium. Maintain a healthy  weight Body mass index (BMI) is used to identify weight problems. It estimates body fat based on height and weight. Your health care provider can help determine your BMI and help you achieve or maintain a healthy weight. Get regular exercise Get regular exercise. This is one of the most important things you can do for your health. Most adults should: Exercise for at least 150 minutes each week. The exercise should increase your heart rate and make you sweat (moderate-intensity exercise). Do strengthening exercises at least twice a week. This is in addition to the moderate-intensity exercise. Spend less time sitting. Even light physical activity can be beneficial. Watch cholesterol and blood lipids Have your blood tested for lipids and cholesterol at 51 years of age, then have this test every 5 years. Have your cholesterol levels checked more often if: Your lipid or cholesterol levels are high. You are older than 51 years of age. You are at high risk for heart disease. What should I know about cancer screening? Depending on your health history and family history, you may need to have cancer screening at various ages. This may include screening for: Breast cancer. Cervical cancer. Colorectal cancer. Skin cancer. Lung cancer. What should I know about heart disease, diabetes, and high blood pressure? Blood pressure and heart disease High blood pressure causes heart disease and increases the risk of stroke. This is more likely to develop in people who have high blood pressure readings or are overweight. Have your blood pressure checked: Every 3-5 years if you are 36-99 years of age. Every year if you are 40 years old or older. Diabetes Have regular diabetes screenings. This checks your fasting blood sugar level. Have the screening done: Once every three years after age 39 if you are at  a normal weight and have a low risk for diabetes. More often and at a younger age if you are overweight or  have a high risk for diabetes. What should I know about preventing infection? Hepatitis B If you have a higher risk for hepatitis B, you should be screened for this virus. Talk with your health care provider to find out if you are at risk for hepatitis B infection. Hepatitis C Testing is recommended for: Everyone born from [birth year] through 1964. Anyone with known risk factors for hepatitis C. Sexually transmitted infections (STIs) Get screened for STIs, including gonorrhea and chlamydia, if: You are sexually active and are younger than 51 years of age. You are older than 51 years of age and your health care provider tells you that you are at risk for this type of infection. Your sexual activity has changed since you were last screened, and you are at increased risk for chlamydia or gonorrhea. Ask your health care provider if you are at risk. Ask your health care provider about whether you are at high risk for HIV. Your health care provider may recommend a prescription medicine to help prevent HIV infection. If you choose to take medicine to prevent HIV, you should first get tested for HIV. You should then be tested every 3 months for as long as you are taking the medicine. Pregnancy If you are about to stop having your period (premenopausal) and you may become pregnant, seek counseling before you get pregnant. Take 400 to 800 micrograms (mcg) of folic acid every day if you become pregnant. Ask for birth control (contraception) if you want to prevent pregnancy. Osteoporosis and menopause Osteoporosis is a disease in which the bones lose minerals and strength with aging. This can result in bone fractures. If you are 7 years old or older, or if you are at risk for osteoporosis and fractures, ask your health care provider if you should: Be screened for bone loss. Take a calcium or vitamin D supplement to lower your risk of fractures. Be given hormone replacement therapy (HRT) to treat symptoms of  menopause. Follow these instructions at home: Alcohol use Do not drink alcohol if: Your health care provider tells you not to drink. You are pregnant, may be pregnant, or are planning to become pregnant. If you drink alcohol: Limit how much you have to: 0-1 drink a day. Know how much alcohol is in your drink. In the [place] one drink equals one 12 oz bottle of beer (355 mL), one 5 oz glass of wine (148 mL), or one 1 oz glass of hard liquor (44 mL). Lifestyle Do not use any products that contain nicotine or tobacco. These products include cigarettes, chewing tobacco, and vaping devices, such as e-cigarettes. If you need help quitting, ask your health care provider. Do not use street drugs. Do not share needles. Ask your health care provider for help if you need support or information about quitting drugs. General instructions Schedule regular health, dental, and eye exams. Stay current with your vaccines. Tell your health care provider if: You often feel depressed. You have ever been abused or do not feel safe at home. Summary Adopting a healthy lifestyle and getting preventive care are important in promoting health and wellness. Follow your health care provider's instructions about healthy diet, exercising, and getting tested or screened for diseases. Follow your health care provider's instructions on monitoring your cholesterol and blood pressure. This information is not intended to replace advice given to you by your  health care provider. Make sure you discuss any questions you have with your health care provider. Document Revised: 01/01/2021 Document Reviewed: 01/01/2021 Elsevier Patient Education  [place], MD [facility]

## 2022-11-06 ENCOUNTER — Ambulatory Visit
Admission: RE | Admit: 2022-11-06 | Discharge: 2022-11-06 | Disposition: A | Discharge status: Home / Self Care | Payer: Managed Care, Other (non HMO) | Source: Ambulatory Visit | Attending: Emergency Medicine | Admitting: Emergency Medicine

## 2022-11-06 DIAGNOSIS — R1901 Right upper quadrant abdominal swelling, mass and lump: Secondary | ICD-10-CM

## 2022-11-07 ENCOUNTER — Encounter: Payer: Self-pay | Admitting: Gastroenterology

## 2022-11-28 ENCOUNTER — Telehealth: Payer: Self-pay

## 2022-11-28 NOTE — Telephone Encounter (Signed)
Three attempts made to complete PV with no answer. VM left for pt to call back by end of day to have her PV rescheduled otherwise PV and colonoscopy would be cancelled.

## 2022-11-29 NOTE — Progress Notes (Deleted)
Office Visit Note  Patient: Joyce Zhang             Date of Birth: 1971-01-15           MRN: [id number]             PCP: Georgina Quint, MD Referring: Georgina Quint, * Visit Date: 12/02/2022 Occupation: @GUAROCC @  Subjective:  No chief complaint on file.   History of Present Illness: Joyce Zhang is a 52 y.o. female ***     Activities of Daily Living:  Patient reports morning stiffness for *** {minute/hour:19697}.   Patient {ACTIONS;DENIES/REPORTS:21021675::"Denies"} nocturnal pain.  Difficulty dressing/grooming: {ACTIONS;DENIES/REPORTS:21021675::"Denies"} Difficulty climbing stairs: {ACTIONS;DENIES/REPORTS:21021675::"Denies"} Difficulty getting out of chair: {ACTIONS;DENIES/REPORTS:21021675::"Denies"} Difficulty using hands for taps, buttons, cutlery, and/or writing: {ACTIONS;DENIES/REPORTS:21021675::"Denies"}  No Rheumatology ROS completed.   PMFS History:  Patient Active Problem List   Diagnosis Date Noted   Leukocytoclastic vasculitis 09/16/2016   Alcohol use 09/13/2016   Vasculitis on skin biopsy 07/02/2016   Episcleritis of both eyes 07/02/2016   High risk medication use 07/02/2016   History of DVT (deep vein thrombosis) 07/02/2016    Past Medical History:  Diagnosis Date   Bone spur of left foot    DVT (deep venous thrombosis) ([facility]) 2012   following casting of leg, noted on chart review   Dysmenorrhea    Episcleritis of both eyes    Fibroid    H/O vasculitis    in legs   Scleritis     Family History  Adopted: Yes  Problem Relation Age of Onset   Diabetes Mother    Asthma Mother    Breast cancer Mother    Diabetes Maternal Grandmother    Asthma Maternal Grandmother    Diabetes Maternal Grandfather    Past Surgical History:  Procedure Laterality Date   broken foot Right    fracture of index finger Left    Social History   Social History Narrative   Not on file   Immunization History  Administered Date(s)  Administered   Moderna Sars-Covid-2 Vaccination 12/31/2019, 01/14/2020   Tdap 12/11/2021     Objective: Vital Signs: There were no vitals taken for this visit.   Physical Exam   Musculoskeletal Exam: ***  CDAI Exam: CDAI Score: -- Patient Global: --; Provider Global: -- Swollen: --; Tender: -- Joint Exam 12/02/2022   No joint exam has been documented for this visit   There is currently no information documented on the homunculus. Go to the Rheumatology activity and complete the homunculus joint exam.  Investigation: No additional findings.  Imaging: US Abdomen Limited  Result Date: 11/06/2022 CLINICAL DATA:  Evaluate for intermittent lump of the right upper quadrant. The patient states the lump is not present on the day of ultrasound exam. EXAM: ULTRASOUND ABDOMEN LIMITED RIGHT UPPER QUADRANT COMPARISON:  None Available. FINDINGS: Gallbladder: No gallstones or wall thickening visualized. No sonographic Murphy sign noted by sonographer. Common bile duct: Diameter: 2.7 mm Liver: No focal lesion identified. Within normal limits in parenchymal echogenicity. Portal vein is patent on color Doppler imaging with normal direction of blood flow towards the liver. Other: None. IMPRESSION: Normal right upper quadrant ultrasound. Electronically Signed   By: Sherian Rein M.D.   On: 11/06/2022 09:10    Recent Labs: Lab Results  Component Value Date   WBC 3.1 (L) 11/04/2022   HGB 13.4 11/04/2022   PLT 183.0 11/04/2022   NA 139 11/04/2022   K 4.1 11/04/2022   CL 104 11/04/2022  CO2 25 11/04/2022   GLUCOSE 104 (H) 11/04/2022   BUN 14 11/04/2022   CREATININE 0.84 11/04/2022   BILITOT 0.5 11/04/2022   ALKPHOS 61 11/04/2022   AST 15 11/04/2022   ALT 17 11/04/2022   PROT 8.3 11/04/2022   ALBUMIN 4.3 11/04/2022   CALCIUM 10.2 11/04/2022   GFRAA 128 01/03/2020   QFTBGOLDPLUS NEGATIVE 05/18/2021    Speciality Comments: [id number], Ttd with MTXx 6 tabs weekly X  6328yr.  Procedures:  No  procedures performed Allergies: Patient has no known allergies.   Assessment / Plan:     Visit Diagnoses: Vasculitis on skin biopsy  Episcleritis of both eyes  High risk medication use  Elevated CK  History of DVT (deep vein thrombosis)  Seasonal allergic rhinitis due to other allergic trigger  Alcohol use  Former smoker  Orders: No orders of the defined types were placed in this encounter.  No orders of the defined types were placed in this encounter.   Face-to-face time spent with patient was *** minutes. Greater than 50% of time was spent in counseling and coordination of care.  Follow-Up Instructions: No follow-ups on file.   Gearldine Bienenstockaylor M Dauna Ziska, PA-C  Note - This record has been created using Dragon software.  Chart creation errors have been sought, but may not always  have been located. Such creation errors do not reflect on  the standard of medical care.

## 2022-12-02 ENCOUNTER — Ambulatory Visit: Payer: Managed Care, Other (non HMO) | Admitting: Physician Assistant

## 2022-12-02 DIAGNOSIS — J3089 Other allergic rhinitis: Secondary | ICD-10-CM

## 2022-12-02 DIAGNOSIS — Z86718 Personal history of other venous thrombosis and embolism: Secondary | ICD-10-CM

## 2022-12-02 DIAGNOSIS — Z789 Other specified health status: Secondary | ICD-10-CM

## 2022-12-02 DIAGNOSIS — H15103 Unspecified episcleritis, bilateral: Secondary | ICD-10-CM

## 2022-12-02 DIAGNOSIS — L959 Vasculitis limited to the skin, unspecified: Secondary | ICD-10-CM

## 2022-12-02 DIAGNOSIS — Z79899 Other long term (current) drug therapy: Secondary | ICD-10-CM

## 2022-12-02 DIAGNOSIS — R748 Abnormal levels of other serum enzymes: Secondary | ICD-10-CM

## 2022-12-02 DIAGNOSIS — Z87891 Personal history of nicotine dependence: Secondary | ICD-10-CM

## 2022-12-02 NOTE — Progress Notes (Deleted)
52 y.o. G0P0000 Single African American female here for annual exam.    PCP:     No LMP recorded. Patient has had an injection.           Sexually active: {yes no:314532}  The current method of family planning is female partner.    Exercising: {yes no:314532}  {types:19826} Smoker:  former  Health Maintenance: Pap:  12/20/19 neg: HR HPV neg, 05/17/15 neg History of abnormal Pap:  no MMG:  04/18/22 Breast Density Cat B, BI-RADS CAT 1 neg Colonoscopy:  n/a BMD:   n/a  Result  n/a TDaP:  12/11/21 Gardasil:   no HIV: 05/17/15 NR Hep C: 05/17/15 Neg Screening Labs:  Hb today: ***, Urine today: ***   reports that she quit smoking about 4 years ago. Her smoking use included cigarettes. She smoked an average of .25 packs per day. She has been exposed to tobacco smoke. She has never used smokeless tobacco. She reports current alcohol use of about 3.0 standard drinks of alcohol per week. She reports that she does not use drugs.  Past Medical History:  Diagnosis Date   Bone spur of left foot    DVT (deep venous thrombosis) ([facility]) 2012   following casting of leg, noted on chart review   Dysmenorrhea    Episcleritis of both eyes    Fibroid    H/O vasculitis    in legs   Scleritis     Past Surgical History:  Procedure Laterality Date   broken foot Right    fracture of index finger Left     Current Outpatient Medications  Medication Sig Dispense Refill   B Complex Vitamins (B COMPLEX PO) Take by mouth daily.     BLACK COHOSH PO Take by mouth daily.     cetirizine (ZYRTEC ALLERGY) 10 MG tablet Take 1 tablet (10 mg total) by mouth daily. 90 tablet 1   folic acid (FOLVITE) 1 MG tablet TAKE 1 TABLET(1 MG) BY MOUTH DAILY 90 tablet 3   methotrexate (RHEUMATREX) 2.5 MG tablet Take 5 tablets (12.5 mg total) by mouth once a week. Caution:Chemotherapy. Protect from light. 60 tablet 0   montelukast (SINGULAIR) 10 MG tablet TAKE 1 TABLET(10 MG) BY MOUTH DAILY 90 tablet 0   Multiple  Vitamins-Minerals (ONE-A-DAY WOMENS PO) Take by mouth daily.     OVER THE COUNTER MEDICATION Sea moss     TURMERIC PO Take by mouth daily.     No current facility-administered medications for this visit.    Family History  Adopted: Yes  Problem Relation Age of Onset   Diabetes Mother    Asthma Mother    Breast cancer Mother    Diabetes Maternal Grandmother    Asthma Maternal Grandmother    Diabetes Maternal Grandfather     Review of Systems  Exam:   There were no vitals taken for this visit.    General appearance: alert, cooperative and appears stated age Head: normocephalic, without obvious abnormality, atraumatic Neck: no adenopathy, supple, symmetrical, trachea midline and thyroid normal to inspection and palpation Lungs: clear to auscultation bilaterally Breasts: normal appearance, no masses or tenderness, No nipple retraction or dimpling, No nipple discharge or bleeding, No axillary adenopathy Heart: regular rate and rhythm Abdomen: soft, non-tender; no masses, no organomegaly Extremities: extremities normal, atraumatic, no cyanosis or edema Skin: skin color, texture, turgor normal. No rashes or lesions Lymph nodes: cervical, supraclavicular, and axillary nodes normal. Neurologic: grossly normal  Pelvic: External genitalia:  no lesions  No abnormal inguinal nodes palpated.              Urethra:  normal appearing urethra with no masses, tenderness or lesions              Bartholins and Skenes: normal                 Vagina: normal appearing vagina with normal color and discharge, no lesions              Cervix: no lesions              Pap taken: {yes no:314532} Bimanual Exam:  Uterus:  normal size, contour, position, consistency, mobility, non-tender              Adnexa: no mass, fullness, tenderness              Rectal exam: {yes no:314532}.  Confirms.              Anus:  normal sphincter tone, no lesions  Chaperone was present for exam:   ***  Assessment:   Well woman visit with gynecologic exam.   Plan: Mammogram screening discussed. Self breast awareness reviewed. Pap and HR HPV as above. Guidelines for Calcium, Vitamin D, regular exercise program including cardiovascular and weight bearing exercise.   Follow up annually and prn.   Additional counseling given.  {yes [id number]. _______ minutes face to face time of which over 50% was spent in counseling.    After visit summary provided.

## 2022-12-11 ENCOUNTER — Encounter: Payer: Self-pay | Admitting: Gastroenterology

## 2022-12-11 ENCOUNTER — Ambulatory Visit (AMBULATORY_SURGERY_CENTER): Payer: Managed Care, Other (non HMO) | Admitting: *Deleted

## 2022-12-11 VITALS — Ht 68.0 in | Wt 222.0 lb

## 2022-12-11 DIAGNOSIS — Z1211 Encounter for screening for malignant neoplasm of colon: Secondary | ICD-10-CM

## 2022-12-11 MED ORDER — NA SULFATE-K SULFATE-MG SULF 17.5-3.13-1.6 GM/177ML PO SOLN: 1.0000 | Freq: Once | ORAL | 0 refills | Status: AC

## 2022-12-11 NOTE — Progress Notes (Signed)

## 2022-12-16 ENCOUNTER — Ambulatory Visit: Payer: BC Managed Care – PPO | Admitting: Obstetrics and Gynecology

## 2022-12-25 HISTORY — PX: COLONOSCOPY W/ POLYPECTOMY: SHX1380

## 2022-12-27 ENCOUNTER — Encounter: Payer: Managed Care, Other (non HMO) | Admitting: Gastroenterology

## 2022-12-30 ENCOUNTER — Encounter: Payer: Self-pay | Admitting: Certified Registered Nurse Anesthetist

## 2023-01-03 ENCOUNTER — Encounter: Payer: Self-pay | Admitting: Gastroenterology

## 2023-01-03 ENCOUNTER — Ambulatory Visit (AMBULATORY_SURGERY_CENTER): Payer: Managed Care, Other (non HMO) | Admitting: Gastroenterology

## 2023-01-03 VITALS — BP 159/82 | HR 81 | Temp 98.6°F | Resp 20 | Ht 68.0 in | Wt 222.0 lb

## 2023-01-03 DIAGNOSIS — Z1211 Encounter for screening for malignant neoplasm of colon: Secondary | ICD-10-CM

## 2023-01-03 DIAGNOSIS — D123 Benign neoplasm of transverse colon: Secondary | ICD-10-CM | POA: Diagnosis not present

## 2023-01-03 DIAGNOSIS — K635 Polyp of colon: Secondary | ICD-10-CM | POA: Diagnosis not present

## 2023-01-03 DIAGNOSIS — D122 Benign neoplasm of ascending colon: Secondary | ICD-10-CM | POA: Diagnosis not present

## 2023-01-03 MED ORDER — SODIUM CHLORIDE 0.9 % IV SOLN
500.0000 mL | Freq: Once | INTRAVENOUS | Status: DC
Start: 2023-01-03 — End: 2023-01-03

## 2023-01-03 NOTE — Patient Instructions (Addendum)
Recommendation:- Patient has a contact number available for                            emergencies. The signs and symptoms of potential                            delayed complications were discussed with the                            patient. Return to normal activities tomorrow.                            Written discharge instructions were provided to the                            patient.                           - Resume previous diet.                           - Continue present medications.                           - Await pathology results.                           - Repeat colonoscopy in 3 - 5 years for                            surveillance based on pathology results.                           - No high dose aspirin, ibuprofen, naproxen, or                            other non-steroidal anti-inflammatory drugs.  Handouts on polyps, diverticulosis and hemorrhoids given.  YOU HAD AN ENDOSCOPIC PROCEDURE TODAY AT THE [facility]:   Refer to the procedure report that was given to you for any specific questions about what was found during the examination.  If the procedure report does not answer your questions, please call your gastroenterologist to clarify.  If you requested that your care partner not be given the details of your procedure findings, then the procedure report has been included in a sealed envelope for you to review at your convenience later.  YOU SHOULD EXPECT: Some feelings of bloating in the abdomen. Passage of more gas than usual.  Walking can help get rid of the air that was put into your GI tract during the procedure and reduce the bloating. If you had a lower endoscopy (such as a colonoscopy or flexible sigmoidoscopy) you may notice spotting of blood in your stool or on the toilet paper. If you underwent a bowel prep for your procedure, you may not have a normal bowel movement for a few days.  Please Note:  You might notice some irritation and  congestion in your nose or some drainage.  This is from the oxygen used  during your procedure.  There is no need for concern and it should clear up in a day or so.  SYMPTOMS TO REPORT IMMEDIATELY:  Following lower endoscopy (colonoscopy or flexible sigmoidoscopy):  Excessive amounts of blood in the stool  Significant tenderness or worsening of abdominal pains  Swelling of the abdomen that is new, acute  Fever of 100F or higher  For urgent or emergent issues, a gastroenterologist can be reached at any hour by calling [phone]. Do not use MyChart messaging for urgent concerns.    DIET:  We do recommend a small meal at first, but then you may proceed to your regular diet.  Drink plenty of fluids but you should avoid alcoholic beverages for 24 hours.  ACTIVITY:  You should plan to take it easy for the rest of today and you should NOT DRIVE or use heavy machinery until tomorrow (because of the sedation medicines used during the test).    FOLLOW UP: Our staff will call the number listed on your records the next business day following your procedure.  We will call around 7:15- 8:00 am to check on you and address any questions or concerns that you may have regarding the information given to you following your procedure. If we do not reach you, we will leave a message.     If any biopsies were taken you will be contacted by phone or by letter within the next 1-3 weeks.  Please call us at [phone] if you have not heard about the biopsies in 3 weeks.    SIGNATURES/CONFIDENTIALITY: You and/or your care partner have signed paperwork which will be entered into your electronic medical record.  These signatures attest to the fact that that the information above on your After Visit Summary has been reviewed and is understood.  Full responsibility of the confidentiality of this discharge information lies with you and/or your care-partner.

## 2023-01-03 NOTE — Progress Notes (Signed)
Report given to PACU, vss 

## 2023-01-03 NOTE — Op Note (Signed)
[facility] Patient Name: Joyce Zhang Procedure Date: 01/03/2023 2:31 PM MRN: [id number] Endoscopist: Napoleon Form , MD, [id number] Age: 52 Referring MD:  Date of Birth: 08-22-1971 Gender: Female Account #: [id number] Procedure:                Colonoscopy Indications:              Screening for colorectal malignant neoplasm Medicines:                Monitored Anesthesia Care Procedure:                Pre-Anesthesia Assessment:                           - Prior to the procedure, a History and Physical                            was performed, and patient medications and                            allergies were reviewed. The patient's tolerance of                            previous anesthesia was also reviewed. The risks                            and benefits of the procedure and the sedation                            options and risks were discussed with the patient.                            All questions were answered, and informed consent                            was obtained. Prior Anticoagulants: The patient has                            taken no anticoagulant or antiplatelet agents. ASA                            Grade Assessment: II - A patient with mild systemic                            disease. After reviewing the risks and benefits,                            the patient was deemed in satisfactory condition to                            undergo the procedure.                           After obtaining informed consent, the colonoscope  was passed under direct vision. Throughout the                            procedure, the patient's blood pressure, pulse, and                            oxygen saturations were monitored continuously. The                            [id number]-HQ190L Colonoscope [id number] was introduced                            through the anus and advanced to the the cecum,                            identified by  appendiceal orifice and ileocecal                            valve. The colonoscopy was performed without                            difficulty. The patient tolerated the procedure                            well. The quality of the bowel preparation was                            good. The ileocecal valve, appendiceal orifice, and                            rectum were photographed. Scope In: 2:48:45 PM Scope Out: 3:03:27 PM Scope Withdrawal Time: 0 hours 12 minutes 24 seconds  Total Procedure Duration: 0 hours 14 minutes 42 seconds  Findings:                 The perianal and digital rectal examinations were                            normal.                           Three sessile polyps were found in the transverse                            colon and ascending colon. The polyps were 4 to 6                            mm in size. These polyps were removed with a cold                            snare. Resection and retrieval were complete.                           Scattered medium-mouthed and small-mouthed  diverticula were found in the sigmoid colon,                            descending colon, transverse colon and ascending                            colon. There was evidence of diverticular spasm.                            Peri-diverticular erythema was seen in the sigmoid                            colon.                           Non-bleeding external and internal hemorrhoids were                            found during retroflexion. The hemorrhoids were                            medium-sized. Complications:            No immediate complications. Estimated Blood Loss:     Estimated blood loss was minimal. Impression:               - Three 4 to 6 mm polyps in the transverse colon                            and in the ascending colon, removed with a cold                            snare. Resected and retrieved.                           - Moderate  diverticulosis in the sigmoid colon, in                            the descending colon, in the transverse colon and                            in the ascending colon. There was evidence of                            diverticular spasm. Peri-diverticular erythema was                            seen.                           - Non-bleeding external and internal hemorrhoids. Recommendation:           - Patient has a contact number available for                            emergencies. The signs and symptoms of potential  delayed complications were discussed with the                            patient. Return to normal activities tomorrow.                            Written discharge instructions were provided to the                            patient.                           - Resume previous diet.                           - Continue present medications.                           - Await pathology results.                           - Repeat colonoscopy in 3 - 5 years for                            surveillance based on pathology results.                           - No high dose aspirin, ibuprofen, naproxen, or                            other non-steroidal anti-inflammatory drugs. Napoleon Form, MD 01/03/2023 3:12:18 PM This report has been signed electronically.

## 2023-01-03 NOTE — Progress Notes (Signed)
[facility] History and Physical   Primary Care Physician:  Georgina Quint, MD   Reason for Procedure:  Colorectal cancer screening  Plan:    Screening colonoscopy with possible interventions as needed     HPI: Joyce Zhang is a very pleasant 52 y.o. female here for screening colonoscopy. Denies any nausea, vomiting, abdominal pain, melena or bright red blood per rectum  The risks and benefits as well as alternatives of endoscopic procedure(s) have been discussed and reviewed. All questions answered. The patient agrees to proceed.    Past Medical History:  Diagnosis Date   Allergy    SEASONAL   Bone spur of left foot    DVT (deep venous thrombosis) (HCC) 2012   following casting of leg, noted on chart review   Dysmenorrhea    Episcleritis of both eyes    Fibroid    H/O vasculitis    in legs   Scleritis     Past Surgical History:  Procedure Laterality Date   broken foot Right    fracture of index finger Left     Prior to Admission medications   Medication Sig Start Date End Date Taking? Authorizing Provider  B Complex Vitamins (B COMPLEX PO) Take by mouth daily.   Yes [provider]  cetirizine (ZYRTEC ALLERGY) 10 MG tablet Take 1 tablet (10 mg total) by mouth daily. 01/24/21  Yes Arnette Felts, FNP  folic acid (FOLVITE) 1 MG tablet TAKE 1 TABLET(1 MG) BY MOUTH DAILY 05/27/22  Yes Gearldine Bienenstock, PA-C  montelukast (SINGULAIR) 10 MG tablet TAKE 1 TABLET(10 MG) BY MOUTH DAILY 09/29/22  Yes Sagardia, Eilleen Kempf, MD  Multiple Vitamins-Minerals (ONE-A-DAY WOMENS PO) Take by mouth daily.   Yes [provider]  OVER THE COUNTER MEDICATION daily. Sea moss   Yes [provider]  BLACK COHOSH PO Take by mouth daily.    [provider]  methotrexate (RHEUMATREX) 2.5 MG tablet Take 5 tablets (12.5 mg total) by mouth once a week. Caution:Chemotherapy. Protect from light. 09/18/22   Gearldine Bienenstock, PA-C  TURMERIC PO  Take by mouth daily.    [provider]    Current Outpatient Medications  Medication Sig Dispense Refill   B Complex Vitamins (B COMPLEX PO) Take by mouth daily.     cetirizine (ZYRTEC ALLERGY) 10 MG tablet Take 1 tablet (10 mg total) by mouth daily. 90 tablet 1   folic acid (FOLVITE) 1 MG tablet TAKE 1 TABLET(1 MG) BY MOUTH DAILY 90 tablet 3   montelukast (SINGULAIR) 10 MG tablet TAKE 1 TABLET(10 MG) BY MOUTH DAILY 90 tablet 0   Multiple Vitamins-Minerals (ONE-A-DAY WOMENS PO) Take by mouth daily.     OVER THE COUNTER MEDICATION daily. Sea moss     BLACK COHOSH PO Take by mouth daily.     methotrexate (RHEUMATREX) 2.5 MG tablet Take 5 tablets (12.5 mg total) by mouth once a week. Caution:Chemotherapy. Protect from light. 60 tablet 0   TURMERIC PO Take by mouth daily.     Current Facility-Administered Medications  Medication Dose Route Frequency Provider Last Rate Last Admin   0.9 %  sodium chloride infusion  500 mL Intravenous Once Napoleon Form, MD        Allergies as of 01/03/2023   (No Known Allergies)    Family History  Adopted: Yes  Problem Relation Age of Onset   Diabetes Mother    Asthma Mother    Breast cancer Mother  Diabetes Maternal Grandmother    Asthma Maternal Grandmother    Diabetes Maternal Grandfather    Colon cancer Neg Hx    Colon polyps Neg Hx    Crohn's disease Neg Hx    Esophageal cancer Neg Hx    Rectal cancer Neg Hx    Stomach cancer Neg Hx    Ulcerative colitis Neg Hx     Social History   Socioeconomic History   Marital status: Single    Spouse name: Not on file   Number of children: Not on file   Years of education: Not on file   Highest education level: Not on file  Occupational History   Not on file  Tobacco Use   Smoking status: Former    Packs/day: .25    Types: Cigarettes    Quit date: 10/25/2018    Years since quitting: 4.1    Passive exposure: Past   Smokeless tobacco: Never  Vaping Use   Vaping Use: Never  used  Substance and Sexual Activity   Alcohol use: Yes    Alcohol/week: 3.0 standard drinks of alcohol    Types: 3 Standard drinks or equivalent per week    Comment: socially   Drug use: No   Sexual activity: Yes    Partners: Female    Birth control/protection: None  Other Topics Concern   Not on file  Social History Narrative   Not on file   Social Determinants of Health   Financial Resource Strain: Not on file  Food Insecurity: Not on file  Transportation Needs: Not on file  Physical Activity: Not on file  Stress: Not on file  Social Connections: Not on file  Intimate Partner Violence: Not on file    Review of Systems:  All other review of systems negative except as mentioned in the HPI.  Physical Exam: Vital signs in last 24 hours: Blood Pressure (Abnormal) 143/102   Pulse 84   Temperature 98.6 F (37 C) (Temporal)   Respiration 15   Height 5\' 8"  (1.727 m)   Weight 222 lb (100.7 kg)   Oxygen Saturation 100%   Body Mass Index 33.75 kg/m  General:   Alert, NAD Lungs:  Clear .   Heart:  Regular rate and rhythm Abdomen:  Soft, nontender and nondistended. Neuro/Psych:  Alert and cooperative. Normal mood and affect. A and O x 3  Reviewed labs, radiology imaging, old records and pertinent past GI work up  Patient is appropriate for planned procedure(s) and anesthesia in an ambulatory setting   K. Scherry Ran , MD [phone]

## 2023-01-03 NOTE — Progress Notes (Signed)
Called to room to assist during endoscopic procedure.  Patient ID and intended procedure confirmed with present staff. Received instructions for my participation in the procedure from the performing physician.  

## 2023-01-03 NOTE — Progress Notes (Signed)
VS completed by CW   Pt's states no medical or surgical changes since previsit or office visit.  

## 2023-01-05 ENCOUNTER — Other Ambulatory Visit: Payer: Self-pay | Admitting: Emergency Medicine

## 2023-01-06 ENCOUNTER — Telehealth: Payer: Self-pay

## 2023-01-06 NOTE — Telephone Encounter (Signed)
  Follow up Call-     01/03/2023    1:59 PM  Call back number  Post procedure Call Back phone  # [phone]  Permission to leave phone message Yes    Left message

## 2023-01-08 ENCOUNTER — Encounter: Payer: Self-pay | Admitting: Gastroenterology

## 2023-01-15 ENCOUNTER — Other Ambulatory Visit: Payer: Self-pay

## 2023-01-15 MED ORDER — METHOTREXATE SODIUM 2.5 MG PO TABS: 12.5000 mg | ORAL_TABLET | ORAL | 0 refills | Status: DC

## 2023-01-15 NOTE — Telephone Encounter (Signed)
Last Fill: 09/18/2022  Labs: 11/04/2022  WBC 3.1 Eosinophils Relative 5.7 Glucose 104  Next Visit: 02/19/2023  Last Visit: 09/18/2022  DX:  Vasculitis on skin biopsy   Current Dose per office note 09/18/2022: Methotrexate 5 tablets by mouth once weekly   Okay to refill Methotrexate?

## 2023-02-05 NOTE — Progress Notes (Deleted)
Office Visit Note  Patient: Joyce Zhang             Date of Birth: 11/04/1970           MRN: [id number]             PCP: Georgina Quint, MD Referring: Georgina Quint, * Visit Date: 02/19/2023 Occupation: @GUAROCC @  Subjective:  No chief complaint on file.   History of Present Illness: [name] is a 52 y.o. female ***     Activities of Daily Living:  Patient reports morning stiffness for *** {minute/hour:19697}.   Patient {ACTIONS;DENIES/REPORTS:21021675::"Denies"} nocturnal pain.  Difficulty dressing/grooming: {ACTIONS;DENIES/REPORTS:21021675::"Denies"} Difficulty climbing stairs: {ACTIONS;DENIES/REPORTS:21021675::"Denies"} Difficulty getting out of chair: {ACTIONS;DENIES/REPORTS:21021675::"Denies"} Difficulty using hands for taps, buttons, cutlery, and/or writing: {ACTIONS;DENIES/REPORTS:21021675::"Denies"}  No Rheumatology ROS completed.   PMFS History:  Patient Active Problem List   Diagnosis Date Noted   Leukocytoclastic vasculitis (HCC) 09/16/2016   Alcohol use 09/13/2016   Vasculitis on skin biopsy 07/02/2016   Episcleritis of both eyes 07/02/2016   High risk medication use 07/02/2016   History of DVT (deep vein thrombosis) 07/02/2016    Past Medical History:  Diagnosis Date   Allergy    SEASONAL   Bone spur of left foot    DVT (deep venous thrombosis) (HCC) 2012   following casting of leg, noted on chart review   Dysmenorrhea    Episcleritis of both eyes    Fibroid    H/O vasculitis    in legs   Scleritis     Family History  Adopted: Yes  Problem Relation Age of Onset   Diabetes Mother    Asthma Mother    Breast cancer Mother    Diabetes Maternal Grandmother    Asthma Maternal Grandmother    Diabetes Maternal Grandfather    Colon cancer Neg Hx    Colon polyps Neg Hx    Crohn's disease Neg Hx    Esophageal cancer Neg Hx    Rectal cancer Neg Hx    Stomach cancer Neg Hx    Ulcerative colitis Neg Hx     Past Surgical History:  Procedure Laterality Date   broken foot Right    fracture of index finger Left    Social History   Social History Narrative   Not on file   Immunization History  Administered Date(s) Administered   Moderna Sars-Covid-2 Vaccination 12/31/2019, 01/14/2020   Tdap 12/11/2021     Objective: Vital Signs: There were no vitals taken for this visit.   Physical Exam   Musculoskeletal Exam: ***  CDAI Exam: CDAI Score: -- Patient Global: --; Provider Global: -- Swollen: --; Tender: -- Joint Exam 02/19/2023   No joint exam has been documented for this visit   There is currently no information documented on the homunculus. Go to the Rheumatology activity and complete the homunculus joint exam.  Investigation: No additional findings.  Imaging: No results found.  Recent Labs: Lab Results  Component Value Date   WBC 3.1 (L) 11/04/2022   HGB 13.4 11/04/2022   PLT 183.0 11/04/2022   NA 139 11/04/2022   K 4.1 11/04/2022   CL 104 11/04/2022   CO2 25 11/04/2022   GLUCOSE 104 (H) 11/04/2022   BUN 14 11/04/2022   CREATININE 0.84 11/04/2022   BILITOT 0.5 11/04/2022   ALKPHOS 61 11/04/2022   AST 15 11/04/2022   ALT 17 11/04/2022   PROT 8.3 11/04/2022   ALBUMIN 4.3 11/04/2022   CALCIUM 10.2 11/04/2022  GFRAA 128 01/03/2020   QFTBGOLDPLUS NEGATIVE 05/18/2021    Speciality Comments: [id number], Ttd with MTXx 6 tabs weekly X  52yr.  Procedures:  No procedures performed Allergies: Patient has no known allergies.   Assessment / Plan:     Visit Diagnoses: Vasculitis on skin biopsy  High risk medication use  Episcleritis of both eyes  Elevated CK  History of DVT (deep vein thrombosis)  Seasonal allergic rhinitis due to other allergic trigger  Alcohol use  Former smoker  Orders: No orders of the defined types were placed in this encounter.  No orders of the defined types were placed in this encounter.   Face-to-face time spent with  patient was *** minutes. Greater than 50% of time was spent in counseling and coordination of care.  Follow-Up Instructions: No follow-ups on file.   Gearldine Bienenstock, PA-C  Note - This record has been created using Dragon software.  Chart creation errors have been sought, but may not always  have been located. Such creation errors do not reflect on  the standard of medical care.

## 2023-02-18 NOTE — Progress Notes (Deleted)
52 y.o. G0P0000 Single African American female here for annual exam.    PCP:     No LMP recorded. Patient has had an injection.           Sexually active: {yes no:314532}  The current method of family planning is Depo-Provera injections.    Exercising: {yes no:314532}  {types:19826} Smoker:  former  Health Maintenance: Pap:  12/20/19 neg: HR HPV neg History of abnormal Pap:  {YES NO:22349} MMG:  04/18/22 Breast Density Cat B, BI-RADS CAT 1 neg Colonoscopy:  01/03/23 BMD:   n/a  Result  n/a TDaP:  12/11/21 Gardasil:   no HIV: 05/18/21 NR Hep C: 9/23/2 NR Screening Labs:  Hb today: ***, Urine today: ***   reports that she quit smoking about 4 years ago. Her smoking use included cigarettes. She smoked an average of .25 packs per day. She has been exposed to tobacco smoke. She has never used smokeless tobacco. She reports current alcohol use of about 3.0 standard drinks of alcohol per week. She reports that she does not use drugs.  Past Medical History:  Diagnosis Date   Allergy    SEASONAL   Bone spur of left foot    DVT (deep venous thrombosis) (HCC) 2012   following casting of leg, noted on chart review   Dysmenorrhea    Episcleritis of both eyes    Fibroid    H/O vasculitis    in legs   Scleritis     Past Surgical History:  Procedure Laterality Date   broken foot Right    fracture of index finger Left     Current Outpatient Medications  Medication Sig Dispense Refill   B Complex Vitamins (B COMPLEX PO) Take by mouth daily.     BLACK COHOSH PO Take by mouth daily.     cetirizine (ZYRTEC ALLERGY) 10 MG tablet Take 1 tablet (10 mg total) by mouth daily. 90 tablet 1   folic acid (FOLVITE) 1 MG tablet TAKE 1 TABLET(1 MG) BY MOUTH DAILY 90 tablet 3   methotrexate (RHEUMATREX) 2.5 MG tablet Take 5 tablets (12.5 mg total) by mouth once a week. Caution:Chemotherapy. Protect from light. 60 tablet 0   montelukast (SINGULAIR) 10 MG tablet TAKE 1 TABLET(10 MG) BY MOUTH DAILY 90  tablet 0   Multiple Vitamins-Minerals (ONE-A-DAY WOMENS PO) Take by mouth daily.     OVER THE COUNTER MEDICATION daily. Sea moss     TURMERIC PO Take by mouth daily.     No current facility-administered medications for this visit.    Family History  Adopted: Yes  Problem Relation Age of Onset   Diabetes Mother    Asthma Mother    Breast cancer Mother    Diabetes Maternal Grandmother    Asthma Maternal Grandmother    Diabetes Maternal Grandfather    Colon cancer Neg Hx    Colon polyps Neg Hx    Crohn's disease Neg Hx    Esophageal cancer Neg Hx    Rectal cancer Neg Hx    Stomach cancer Neg Hx    Ulcerative colitis Neg Hx     Review of Systems  Exam:   There were no vitals taken for this visit.    General appearance: alert, cooperative and appears stated age Head: normocephalic, without obvious abnormality, atraumatic Neck: no adenopathy, supple, symmetrical, trachea midline and thyroid normal to inspection and palpation Lungs: clear to auscultation bilaterally Breasts: normal appearance, no masses or tenderness, No nipple retraction or dimpling, No  nipple discharge or bleeding, No axillary adenopathy Heart: regular rate and rhythm Abdomen: soft, non-tender; no masses, no organomegaly Extremities: extremities normal, atraumatic, no cyanosis or edema Skin: skin color, texture, turgor normal. No rashes or lesions Lymph nodes: cervical, supraclavicular, and axillary nodes normal. Neurologic: grossly normal  Pelvic: External genitalia:  no lesions              No abnormal inguinal nodes palpated.              Urethra:  normal appearing urethra with no masses, tenderness or lesions              Bartholins and Skenes: normal                 Vagina: normal appearing vagina with normal color and discharge, no lesions              Cervix: no lesions              Pap taken: {yes no:314532} Bimanual Exam:  Uterus:  normal size, contour, position, consistency, mobility,  non-tender              Adnexa: no mass, fullness, tenderness              Rectal exam: {yes no:314532}.  Confirms.              Anus:  normal sphincter tone, no lesions  Chaperone was present for exam:  ***  Assessment:   Well woman visit with gynecologic exam.   Plan: Mammogram screening discussed. Self breast awareness reviewed. Pap and HR HPV as above. Guidelines for Calcium, Vitamin D, regular exercise program including cardiovascular and weight bearing exercise.   Follow up annually and prn.   Additional counseling given.  {yes [id number]. _______ minutes face to face time of which over 50% was spent in counseling.    After visit summary provided.    '

## 2023-02-19 ENCOUNTER — Ambulatory Visit: Payer: Managed Care, Other (non HMO) | Admitting: Physician Assistant

## 2023-02-19 DIAGNOSIS — R748 Abnormal levels of other serum enzymes: Secondary | ICD-10-CM

## 2023-02-19 DIAGNOSIS — H15103 Unspecified episcleritis, bilateral: Secondary | ICD-10-CM

## 2023-02-19 DIAGNOSIS — Z87891 Personal history of nicotine dependence: Secondary | ICD-10-CM

## 2023-02-19 DIAGNOSIS — J3089 Other allergic rhinitis: Secondary | ICD-10-CM

## 2023-02-19 DIAGNOSIS — L959 Vasculitis limited to the skin, unspecified: Secondary | ICD-10-CM

## 2023-02-19 DIAGNOSIS — Z86718 Personal history of other venous thrombosis and embolism: Secondary | ICD-10-CM

## 2023-02-19 DIAGNOSIS — Z79899 Other long term (current) drug therapy: Secondary | ICD-10-CM

## 2023-02-19 DIAGNOSIS — Z789 Other specified health status: Secondary | ICD-10-CM

## 2023-03-04 ENCOUNTER — Ambulatory Visit: Payer: BC Managed Care – PPO | Admitting: Obstetrics and Gynecology

## 2023-03-05 NOTE — Progress Notes (Signed)
Office Visit Note  Patient: Joyce Zhang             Date of Birth: [birth year]           MRN: [id number]             PCP: Georgina Quint, MD Referring: Georgina Quint, * Visit Date: 03/19/2023 Occupation: @GUAROCC @  Subjective:  Vasculitic rash   History of Present Illness: Joyce Zhang is a 52 y.o. female with history of vasculitis and episcleritis.  Patient remains on Methotrexate 5 tablets by mouth once weekly and folic acid 1 mg daily.  She has been tolerating methotrexate without any side effects and has not missed any doses recently.  Patient reports that last month in June 2024 she developed his flare of scleritis in both eyes, left worse than right.  She was evaluated by Dr. Noah Charon and was prescribed eye drops.   Patient states that yesterday she started to notice a recurrence of a vasculitic rash on her lower legs.  She states that her legs were dangling from a stool for several hours and she noticed increased fluid retention yesterday evening. She denies any joint pain or joint swelling at this time.  She denies any recent or recurrent infections.    Activities of Daily Living:  Patient reports morning stiffness for 0 minutes.   Patient Denies nocturnal pain.  Difficulty dressing/grooming: Denies Difficulty climbing stairs: Denies Difficulty getting out of chair: Denies Difficulty using hands for taps, buttons, cutlery, and/or writing: Denies  Review of Systems  Constitutional:  Negative for fatigue.  HENT:  Negative for mouth sores and mouth dryness.   Eyes:  Negative for dryness.  Respiratory:  Negative for shortness of breath.   Cardiovascular:  Negative for chest pain and palpitations.  Gastrointestinal:  Negative for blood in stool, constipation and diarrhea.  Endocrine: Negative for increased urination.  Genitourinary:  Negative for involuntary urination.  Musculoskeletal:  Negative for joint pain, gait problem, joint pain, joint  swelling, myalgias, muscle weakness, morning stiffness, muscle tenderness and myalgias.  Skin:  Negative for color change, rash, hair loss and sensitivity to sunlight.  Allergic/Immunologic: Negative for susceptible to infections.  Neurological:  Negative for dizziness and headaches.  Hematological:  Negative for swollen glands.  Psychiatric/Behavioral:  Positive for sleep disturbance. Negative for depressed mood. The patient is not nervous/anxious.     PMFS History:  Patient Active Problem List   Diagnosis Date Noted   Leukocytoclastic vasculitis (HCC) 09/16/2016   Alcohol use 09/13/2016   Vasculitis on skin biopsy 07/02/2016   Episcleritis of both eyes 07/02/2016   High risk medication use 07/02/2016   History of DVT (deep vein thrombosis) 07/02/2016    Past Medical History:  Diagnosis Date   Allergy    SEASONAL   Bone spur of left foot    DVT (deep venous thrombosis) (HCC) 2012   following casting of leg, noted on chart review   Dysmenorrhea    Episcleritis of both eyes    Fibroid    H/O vasculitis    in legs   Scleritis     Family History  Adopted: Yes  Problem Relation Age of Onset   Diabetes Mother    Asthma Mother    Breast cancer Mother    Diabetes Maternal Grandmother    Asthma Maternal Grandmother    Diabetes Maternal Grandfather    Colon cancer Neg Hx    Colon polyps Neg Hx    Crohn's disease  Neg Hx    Esophageal cancer Neg Hx    Rectal cancer Neg Hx    Stomach cancer Neg Hx    Ulcerative colitis Neg Hx    Past Surgical History:  Procedure Laterality Date   broken foot Right    COLONOSCOPY W/ POLYPECTOMY  12/2022   fracture of index finger Left    Social History   Social History Narrative   Not on file   Immunization History  Administered Date(s) Administered   Moderna Sars-Covid-2 Vaccination 12/31/2019, 01/14/2020   Tdap 12/11/2021     Objective: Vital Signs: BP (!) 161/101 (BP Location: Right Arm, Patient Position: Sitting, Cuff Size:  Large)   Pulse 80   Resp 17   Ht 5\' 8"  (1.727 m)   Wt 223 lb 9.6 oz (101.4 kg)   LMP 11/20/2021   BMI 34.00 kg/m    Physical Exam Vitals and nursing note reviewed.  Constitutional:      Appearance: She is well-developed.  HENT:     Head: Normocephalic and atraumatic.  Eyes:     Conjunctiva/sclera: Conjunctivae normal.  Cardiovascular:     Rate and Rhythm: Normal rate and regular rhythm.     Heart sounds: Normal heart sounds.  Pulmonary:     Effort: Pulmonary effort is normal.     Breath sounds: Normal breath sounds.  Abdominal:     General: Bowel sounds are normal.     Palpations: Abdomen is soft.  Musculoskeletal:     Cervical back: Normal range of motion.  Skin:    General: Skin is warm and dry.     Capillary Refill: Capillary refill takes less than 2 seconds.     Comments: Erythematous macules and papules scattered on lower legs.  Tender papule on lateral aspect of the left lower leg noted.  Neurological:     Mental Status: She is alert and oriented to person, place, and time.  Psychiatric:        Behavior: Behavior normal.      Musculoskeletal Exam: C-spine, thoracic spine, and lumbar spine have good ROM.  Shoulder joints, elbow joints, wrist joints, MCPs, PIPs, and DIPs good ROM with no synovitis.  Complete fist formation bilaterally.  Hip joints, knee joints, and ankle joints have good ROM with no discomfort.  No warmth or effusion of knee joints.  No tenderness or swelling of ankle joints.   CDAI Exam: CDAI Score: -- Patient Global: --; Provider Global: -- Swollen: --; Tender: -- Joint Exam 03/19/2023   No joint exam has been documented for this visit   There is currently no information documented on the homunculus. Go to the Rheumatology activity and complete the homunculus joint exam.  Investigation: No additional findings.  Imaging: No results found.  Recent Labs: Lab Results  Component Value Date   WBC 3.1 (L) 11/04/2022   HGB 13.4 11/04/2022    PLT 183.0 11/04/2022   NA 139 11/04/2022   K 4.1 11/04/2022   CL 104 11/04/2022   CO2 25 11/04/2022   GLUCOSE 104 (H) 11/04/2022   BUN 14 11/04/2022   CREATININE 0.84 11/04/2022   BILITOT 0.5 11/04/2022   ALKPHOS 61 11/04/2022   AST 15 11/04/2022   ALT 17 11/04/2022   PROT 8.3 11/04/2022   ALBUMIN 4.3 11/04/2022   CALCIUM 10.2 11/04/2022   GFRAA 128 01/03/2020   QFTBGOLDPLUS NEGATIVE 05/18/2021    Speciality Comments: [id number], Ttd with MTXx 6 tabs weekly X  31yr.  Procedures:  No procedures performed Allergies:  Patient has no known allergies.     Assessment / Plan:     Visit Diagnoses: Vasculitis on skin biopsy - 07/04/16: ANCA-, ANA-, RF<14, anti-CCP<16, TSH 0.59, ESR 14, CK 200, HX of remission on methotrexate in the past: Patient presents today experiencing early signs of a flare-erythematous macular papular lesions noted scattered on bilateral lower legs.  Tender papule noted on the lateral aspect of her left lower leg.  Patient remains on methotrexate 5 tablets by mouth once weekly along with folic acid 1 mg daily.  She has not missed any doses of methotrexate recently.  She has been under increased stress which she relates to work and has since resigned.  Plan on checking CBC, CMP, and sed rate today.  A prednisone taper starting at 20 mg tapering by 5 mg every 4 days will be sent to the pharmacy today.  We will notify her of lab results.  She has been on a reduced dose of methotrexate due to history of neutropenia.  White blood cell count was 3.1 on 11/04/2022.  CBC with differential will be repeated today.  If her white blood cell count is within normal limits or stable we can consider increasing the dose of methotrexate with close lab monitoring for better control of her vasculitis and episcleritis. - Plan: Sedimentation rate  High risk medication use - Methotrexate 5 tablets by mouth once weekly and folic acid 1 mg daily.  CBC and CMP updated on 11/04/22. Orders for CBC and CMP  released today. Her next lab work will be due in October and every 3 months.  No recent or recurrent infections. Discussed the importance of holding Methotrexate if she develops signs or symptoms of an infection and to resume once the infection has completley cleared.  - Plan: COMPLETE METABOLIC PANEL WITH GFR, CBC with Differential/Platelet  Episcleritis of both eyes - Previously treated by Dr. Dagoberto Ligas at [facility] ophthalmology.  Under care of Dr. Noah Charon.  Flare in June 2024 requiring a course of eyedrops-patient unsure of prescription provided.  No conjunctival injection at this time.   Elevated CK - CK was mildly elevated in the past.CK WNL-38 on 09/18/22.  No muscular weakness.  Other medical conditions are listed as follows:   History of DVT (deep vein thrombosis) - 2012 after cast on her right leg.  Seasonal allergic rhinitis due to other allergic trigger  Alcohol use  Former smoker - She quit smoking in 2020.  Half a pack per day for 30 years.  Orders: Orders Placed This Encounter  Procedures   COMPLETE METABOLIC PANEL WITH GFR   CBC with Differential/Platelet   Sedimentation rate   Meds ordered this encounter  Medications   predniSONE (DELTASONE) 5 MG tablet    Sig: Take 4 tablets by mouth daily x4 days, 3 tablets daily x4 days, 2 tablets daily x4 days, 1 tablet daily x4 days.    Dispense:  40 tablet    Refill:  0     Follow-Up Instructions: Return in about 5 months (around 08/19/2023) for Vasculitis, Episcleritis.   Gearldine Bienenstock, PA-C  Note - This record has been created using Dragon software.  Chart creation errors have been sought, but may not always  have been located. Such creation errors do not reflect on  the standard of medical care.

## 2023-03-13 NOTE — Progress Notes (Unsigned)
52 y.o. G0P0000 Single African American female here for annual exam.    PCP:     No LMP recorded. Patient has had an injection.           Sexually active: {yes no:314532}  The current method of family planning is Depo-Provera injections.    Exercising: {yes no:314532}  {types:19826} Smoker:  no  Health Maintenance: Pap:  12/20/19 neg: HR HPV neg History of abnormal Pap:  {YES NO:22349} MMG:  04/18/22 Breast density Cat B, BI-RADS CAT 1 neg Colonoscopy:  01/03/23 BMD:   n/a  Result  n/a TDaP:  12/11/21 Gardasil:   no HIV: 05/18/21 NR Hep C: 05/18/21 NR Screening Labs:  Hb today: ***, Urine today: ***   reports that she quit smoking about 4 years ago. Her smoking use included cigarettes. She has been exposed to tobacco smoke. She has never used smokeless tobacco. She reports current alcohol use of about 3.0 standard drinks of alcohol per week. She reports that she does not use drugs.  Past Medical History:  Diagnosis Date   Allergy    SEASONAL   Bone spur of left foot    DVT (deep venous thrombosis) (HCC) 2012   following casting of leg, noted on chart review   Dysmenorrhea    Episcleritis of both eyes    Fibroid    H/O vasculitis    in legs   Scleritis     Past Surgical History:  Procedure Laterality Date   broken foot Right    fracture of index finger Left     Current Outpatient Medications  Medication Sig Dispense Refill   B Complex Vitamins (B COMPLEX PO) Take by mouth daily.     BLACK COHOSH PO Take by mouth daily.     cetirizine (ZYRTEC ALLERGY) 10 MG tablet Take 1 tablet (10 mg total) by mouth daily. 90 tablet 1   folic acid (FOLVITE) 1 MG tablet TAKE 1 TABLET(1 MG) BY MOUTH DAILY 90 tablet 3   methotrexate (RHEUMATREX) 2.5 MG tablet Take 5 tablets (12.5 mg total) by mouth once a week. Caution:Chemotherapy. Protect from light. 60 tablet 0   montelukast (SINGULAIR) 10 MG tablet TAKE 1 TABLET(10 MG) BY MOUTH DAILY 90 tablet 0   Multiple Vitamins-Minerals (ONE-A-DAY  WOMENS PO) Take by mouth daily.     OVER THE COUNTER MEDICATION daily. Sea moss     TURMERIC PO Take by mouth daily.     No current facility-administered medications for this visit.    Family History  Adopted: Yes  Problem Relation Age of Onset   Diabetes Mother    Asthma Mother    Breast cancer Mother    Diabetes Maternal Grandmother    Asthma Maternal Grandmother    Diabetes Maternal Grandfather    Colon cancer Neg Hx    Colon polyps Neg Hx    Crohn's disease Neg Hx    Esophageal cancer Neg Hx    Rectal cancer Neg Hx    Stomach cancer Neg Hx    Ulcerative colitis Neg Hx     Review of Systems  Exam:   There were no vitals taken for this visit.    General appearance: alert, cooperative and appears stated age Head: normocephalic, without obvious abnormality, atraumatic Neck: no adenopathy, supple, symmetrical, trachea midline and thyroid normal to inspection and palpation Lungs: clear to auscultation bilaterally Breasts: normal appearance, no masses or tenderness, No nipple retraction or dimpling, No nipple discharge or bleeding, No axillary adenopathy Heart: regular  rate and rhythm Abdomen: soft, non-tender; no masses, no organomegaly Extremities: extremities normal, atraumatic, no cyanosis or edema Skin: skin color, texture, turgor normal. No rashes or lesions Lymph nodes: cervical, supraclavicular, and axillary nodes normal. Neurologic: grossly normal  Pelvic: External genitalia:  no lesions              No abnormal inguinal nodes palpated.              Urethra:  normal appearing urethra with no masses, tenderness or lesions              Bartholins and Skenes: normal                 Vagina: normal appearing vagina with normal color and discharge, no lesions              Cervix: no lesions              Pap taken: {yes no:314532} Bimanual Exam:  Uterus:  normal size, contour, position, consistency, mobility, non-tender              Adnexa: no mass, fullness,  tenderness              Rectal exam: {yes no:314532}.  Confirms.              Anus:  normal sphincter tone, no lesions  Chaperone was present for exam:  ***  Assessment:   Well woman visit with gynecologic exam.   Plan: Mammogram screening discussed. Self breast awareness reviewed. Pap and HR HPV as above. Guidelines for Calcium, Vitamin D, regular exercise program including cardiovascular and weight bearing exercise.   Follow up annually and prn.   Additional counseling given.  {yes [id number]. _______ minutes face to face time of which over 50% was spent in counseling.    After visit summary provided.

## 2023-03-19 ENCOUNTER — Encounter: Payer: Self-pay | Admitting: Physician Assistant

## 2023-03-19 ENCOUNTER — Ambulatory Visit (INDEPENDENT_AMBULATORY_CARE_PROVIDER_SITE_OTHER): Payer: Managed Care, Other (non HMO) | Admitting: Obstetrics and Gynecology

## 2023-03-19 ENCOUNTER — Encounter: Payer: Self-pay | Admitting: Obstetrics and Gynecology

## 2023-03-19 ENCOUNTER — Ambulatory Visit: Payer: Managed Care, Other (non HMO) | Attending: Physician Assistant | Admitting: Physician Assistant

## 2023-03-19 VITALS — BP 161/101 | HR 80 | Resp 17 | Ht 68.0 in | Wt 223.6 lb

## 2023-03-19 VITALS — BP 126/82 | HR 83 | Ht 68.0 in | Wt 220.0 lb

## 2023-03-19 DIAGNOSIS — N951 Menopausal and female climacteric states: Secondary | ICD-10-CM

## 2023-03-19 DIAGNOSIS — Z87891 Personal history of nicotine dependence: Secondary | ICD-10-CM

## 2023-03-19 DIAGNOSIS — Z79899 Other long term (current) drug therapy: Secondary | ICD-10-CM

## 2023-03-19 DIAGNOSIS — R748 Abnormal levels of other serum enzymes: Secondary | ICD-10-CM | POA: Diagnosis not present

## 2023-03-19 DIAGNOSIS — H15103 Unspecified episcleritis, bilateral: Secondary | ICD-10-CM

## 2023-03-19 DIAGNOSIS — L959 Vasculitis limited to the skin, unspecified: Secondary | ICD-10-CM | POA: Diagnosis not present

## 2023-03-19 DIAGNOSIS — Z01419 Encounter for gynecological examination (general) (routine) without abnormal findings: Secondary | ICD-10-CM

## 2023-03-19 DIAGNOSIS — Z789 Other specified health status: Secondary | ICD-10-CM

## 2023-03-19 DIAGNOSIS — Z86718 Personal history of other venous thrombosis and embolism: Secondary | ICD-10-CM

## 2023-03-19 DIAGNOSIS — J3089 Other allergic rhinitis: Secondary | ICD-10-CM

## 2023-03-19 LAB — CBC WITH DIFFERENTIAL/PLATELET
Absolute Monocytes: 111 cells/uL — ABNORMAL LOW (ref 200–950)
Basophils Absolute: 11 cells/uL (ref 0–200)
Basophils Relative: 0.4 %
Eosinophils Absolute: 143 cells/uL (ref 15–500)
Eosinophils Relative: 5.3 %
HCT: 34.9 % — ABNORMAL LOW (ref 35.0–45.0)
MCHC: 32.7 g/dL (ref 32.0–36.0)
MCV: 87.3 fL (ref 80.0–100.0)
MPV: 10.6 fL (ref 7.5–12.5)
Monocytes Relative: 4.1 %
Neutro Abs: 1288 cells/uL — ABNORMAL LOW (ref 1500–7800)
Neutrophils Relative %: 47.7 %
RBC: 4 10*6/uL (ref 3.80–5.10)
RDW: 13.6 % (ref 11.0–15.0)
WBC: 2.7 10*3/uL — ABNORMAL LOW (ref 3.8–10.8)

## 2023-03-19 MED ORDER — PREDNISONE 5 MG PO TABS: ORAL_TABLET | ORAL | 0 refills | Status: DC

## 2023-03-19 NOTE — Patient Instructions (Signed)

## 2023-03-20 LAB — COMPLETE METABOLIC PANEL WITH GFR
AG Ratio: 1.3 (calc) (ref 1.0–2.5)
ALT: 23 U/L (ref 6–29)
AST: 14 U/L (ref 10–35)
Albumin: 4 g/dL (ref 3.6–5.1)
Alkaline phosphatase (APISO): 61 U/L (ref 37–153)
BUN: 14 mg/dL (ref 7–25)
CO2: 26 mmol/L (ref 20–32)
Calcium: 8.9 mg/dL (ref 8.6–10.4)
Creat: 0.66 mg/dL (ref 0.50–1.03)
Globulin: 3 g/dL (calc) (ref 1.9–3.7)
Glucose, Bld: 104 mg/dL — ABNORMAL HIGH (ref 65–99)
Potassium: 3.8 mmol/L (ref 3.5–5.3)
Total Protein: 7 g/dL (ref 6.1–8.1)
eGFR: 107 mL/min/{1.73_m2} (ref >=60)

## 2023-03-20 LAB — FOLLICLE STIMULATING HORMONE: FSH: 46.2 m[IU]/mL

## 2023-03-20 LAB — CBC WITH DIFFERENTIAL/PLATELET
Hemoglobin: 11.4 g/dL — ABNORMAL LOW (ref 11.7–15.5)
Lymphs Abs: 1148 cells/uL (ref 850–3900)
MCH: 28.5 pg (ref 27.0–33.0)
Platelets: 132 10*3/uL — ABNORMAL LOW (ref 140–400)
Total Lymphocyte: 42.5 %

## 2023-03-20 LAB — ESTRADIOL: Estradiol: <15 pg/mL

## 2023-03-21 ENCOUNTER — Telehealth: Payer: Self-pay

## 2023-03-21 DIAGNOSIS — Z79899 Other long term (current) drug therapy: Secondary | ICD-10-CM

## 2023-03-21 DIAGNOSIS — L959 Vasculitis limited to the skin, unspecified: Secondary | ICD-10-CM

## 2023-03-21 NOTE — Progress Notes (Signed)
ESR WNL Glucose is 104. Rest of CMP WNL.  WBC count is low-2.7.  Hemoglobin and hematocrit are both low. Platelet count low-132K.   Recommend repeating CBC in 1 month.  Please place future order for TPMT in case we have to switch the patient to imuran in the future.

## 2023-03-21 NOTE — Telephone Encounter (Signed)
-----   Message from Gearldine Bienenstock sent at 03/21/2023  8:04 AM EDT ----- ESR WNL Glucose is 104. Rest of CMP WNL.  WBC count is low-2.7.  Hemoglobin and hematocrit are both low. Platelet count low-132K.   Recommend repeating CBC in 1 month.  Please place future order for TPMT in case we have to switch the patient to imuran in the future.

## 2023-04-08 ENCOUNTER — Other Ambulatory Visit: Payer: Self-pay | Admitting: Emergency Medicine

## 2023-05-24 IMAGING — MG MM DIGITAL SCREENING BILAT W/ TOMO AND CAD
6 of 10 series · 6 of 30 positions shown · non-contrast
Comparison: Previous exam(s).

CLINICAL DATA: Screening.

EXAM:
DIGITAL SCREENING BILATERAL MAMMOGRAM WITH TOMOSYNTHESIS AND CAD
TECHNIQUE: Bilateral screening digital craniocaudal and mediolateral oblique
mammograms were obtained. Bilateral screening digital breast
tomosynthesis was performed. The images were evaluated with
computer-aided detection.

[L XCCL synth-2D]
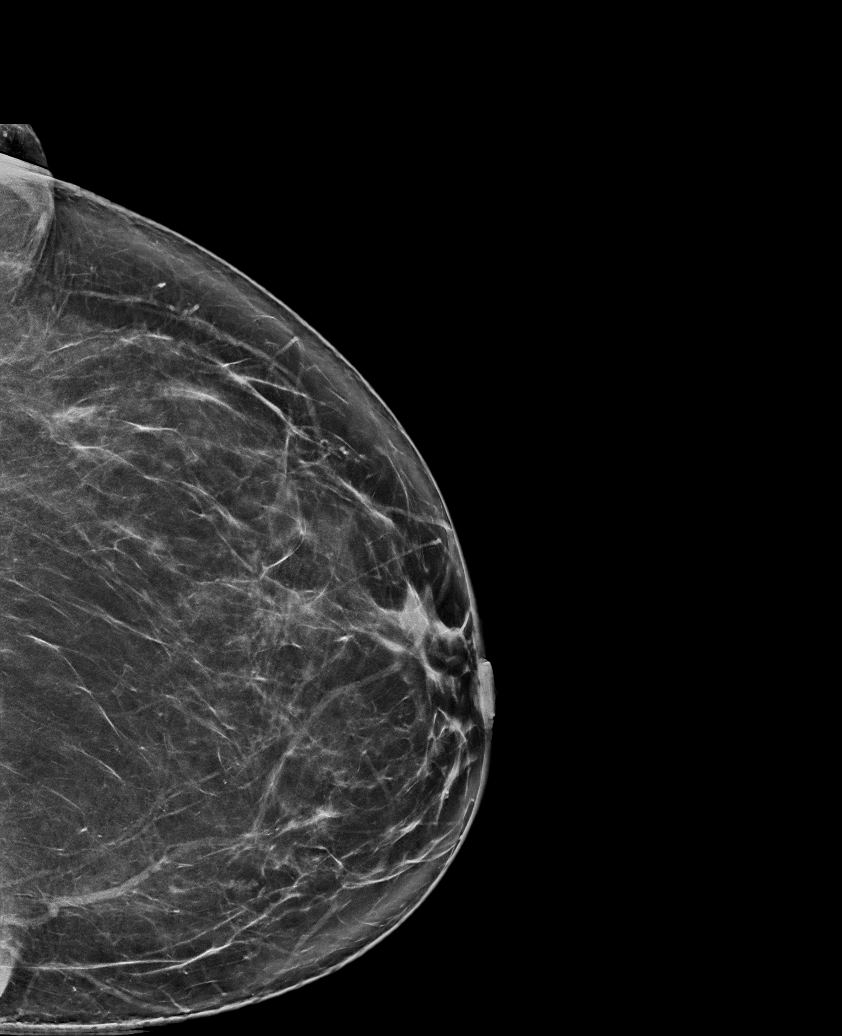

[R MLO synth-2D]
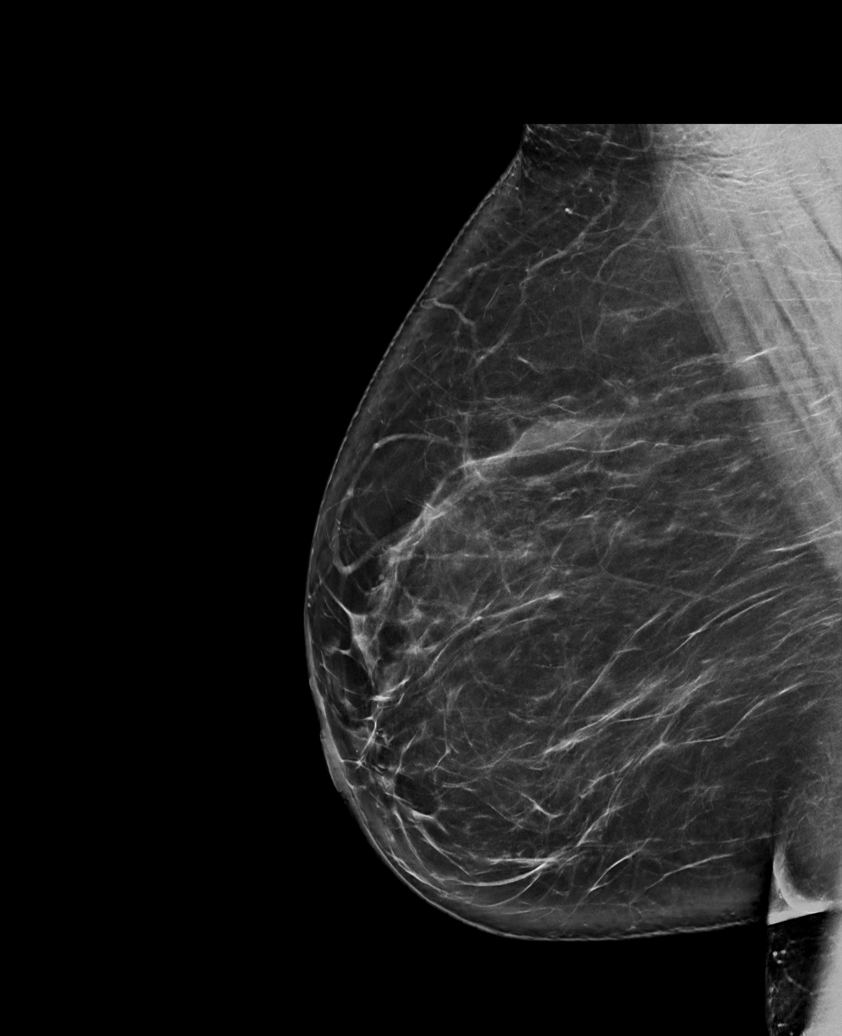

[L MLO synth-2D]
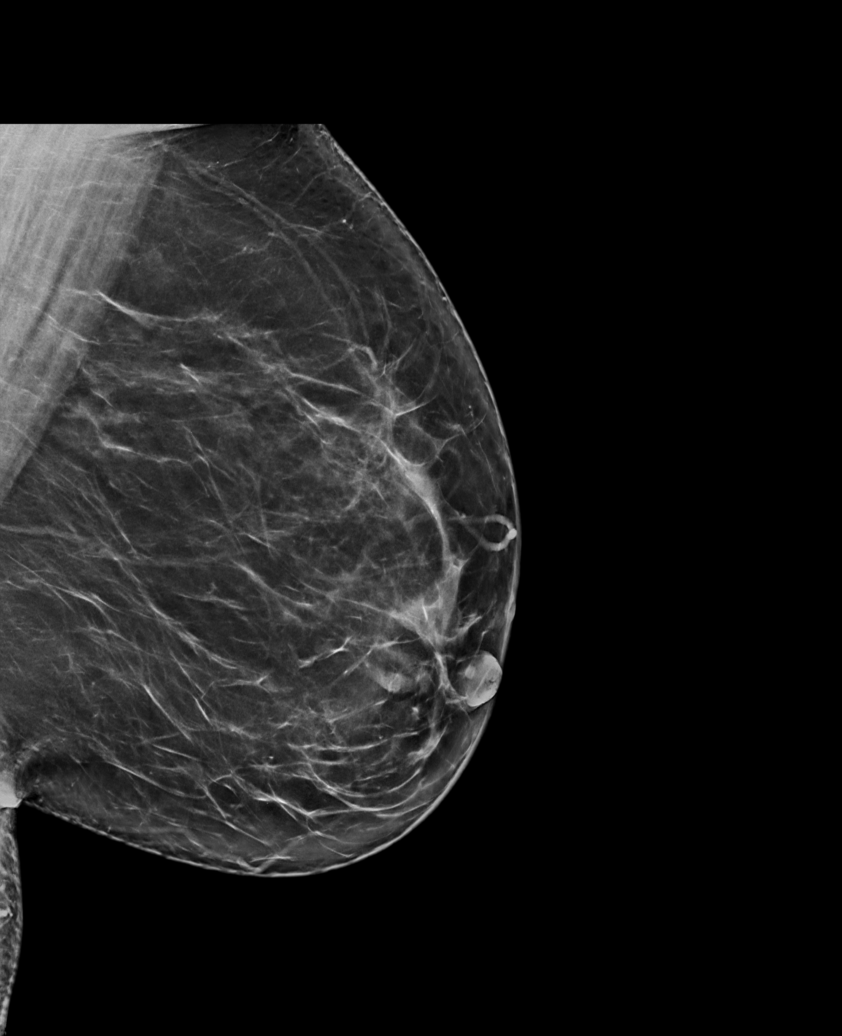

[L CC synth-2D]
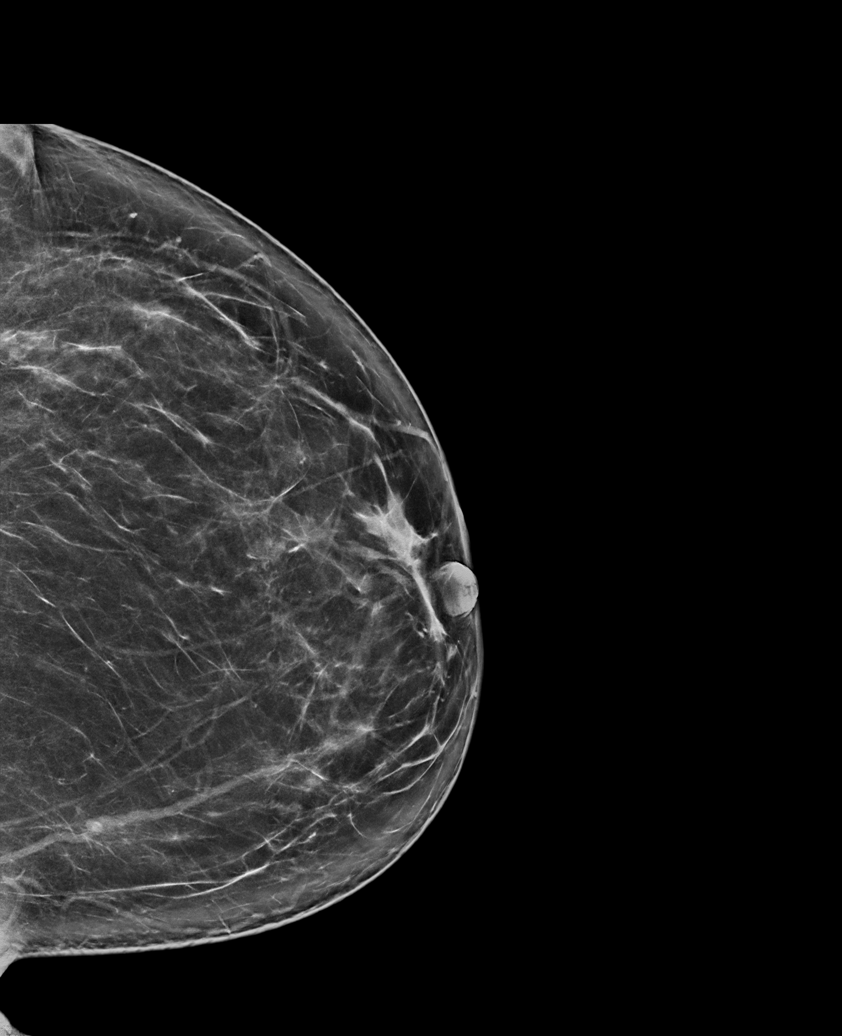

[R CC synth-2D]
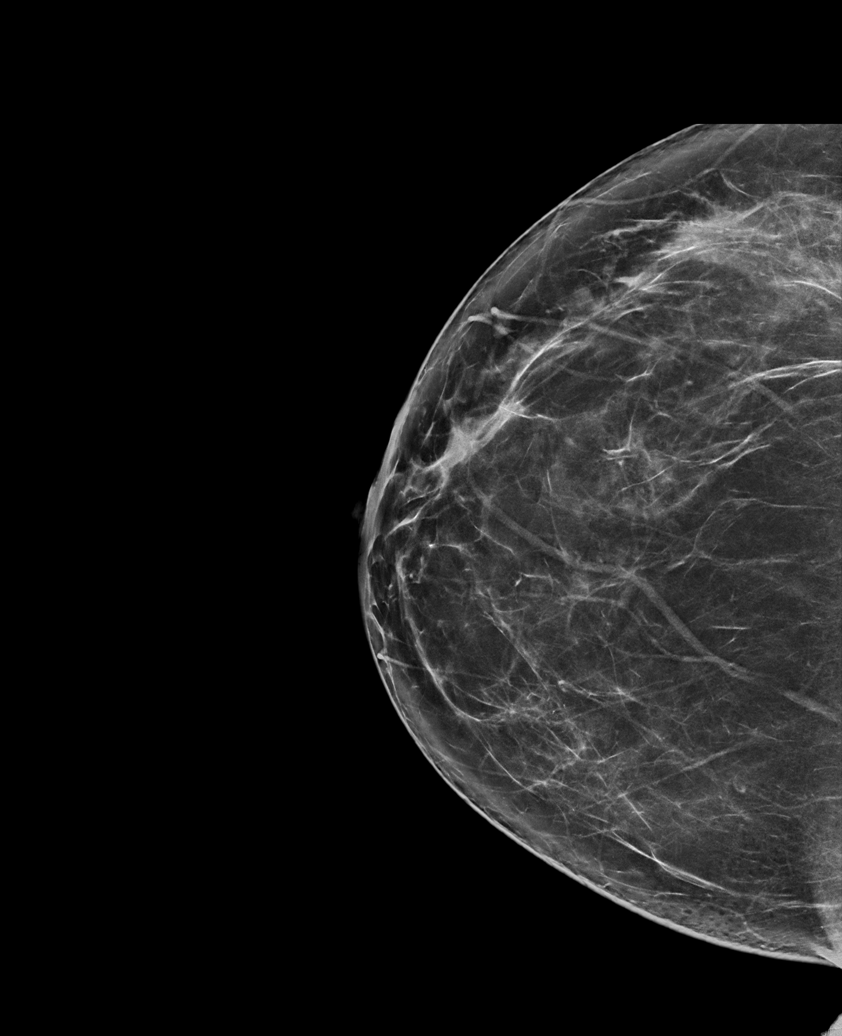

[R MLO tomo · tomo slice 47/92.0]
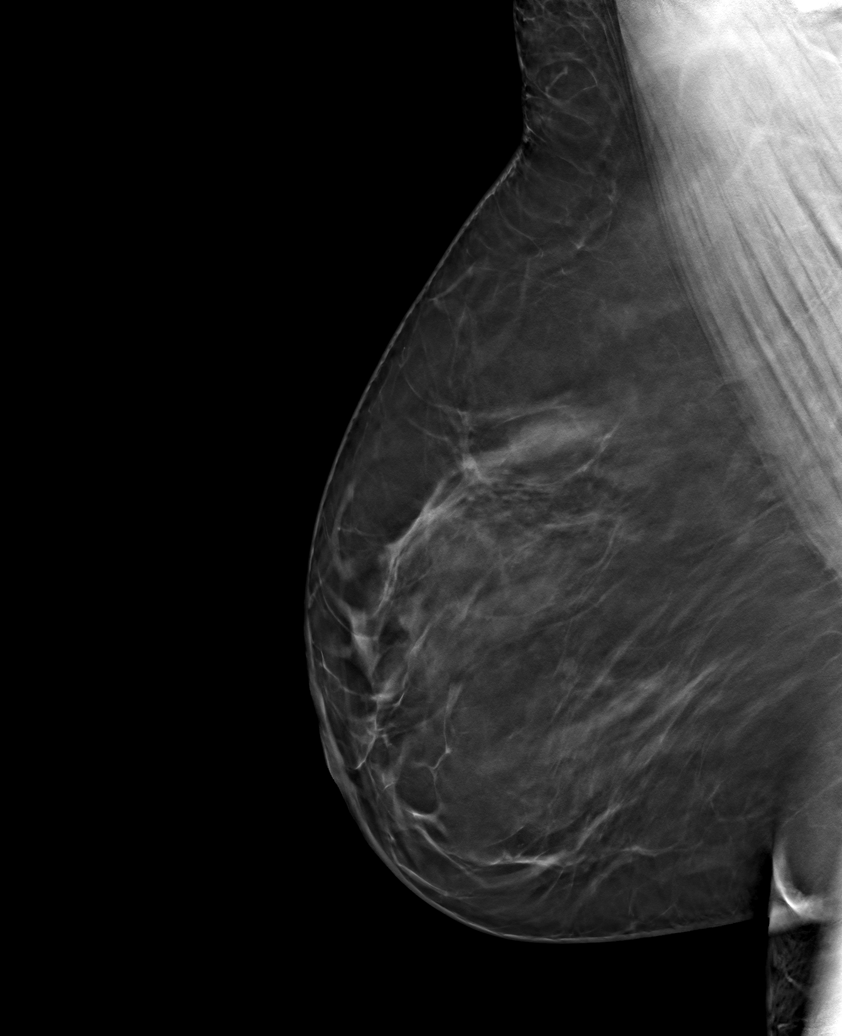

[6 of 30 positions shown; findings below may reference images not displayed]

ACR Breast Density Category b: There are scattered areas of
fibroglandular density.
FINDINGS: There are no findings suspicious for malignancy.
IMPRESSION: No mammographic evidence of malignancy. A result letter of this
screening mammogram will be mailed directly to the patient.

RECOMMENDATION:
Screening mammogram in one year. (Code:51-O-LD2)

BI-RADS CATEGORY  1: Negative.

## 2023-08-06 ENCOUNTER — Other Ambulatory Visit: Payer: Self-pay | Admitting: Obstetrics and Gynecology

## 2023-08-06 DIAGNOSIS — Z0000 Encounter for general adult medical examination without abnormal findings: Secondary | ICD-10-CM

## 2023-08-07 NOTE — Progress Notes (Deleted)
Office Visit Note  Patient: Joyce Zhang             Date of Birth: 06-30-1971           MRN: [id number]             PCP: Georgina Quint, MD Referring: Georgina Quint, * Visit Date: 08/21/2023 Occupation: @GUAROCC @  Subjective:  No chief complaint on file.   History of Present Illness: Joyce Zhang is a 52 y.o. female ***     Activities of Daily Living:  Patient reports morning stiffness for *** {minute/hour:19697}.   Patient {ACTIONS;DENIES/REPORTS:21021675::"Denies"} nocturnal pain.  Difficulty dressing/grooming: {ACTIONS;DENIES/REPORTS:21021675::"Denies"} Difficulty climbing stairs: {ACTIONS;DENIES/REPORTS:21021675::"Denies"} Difficulty getting out of chair: {ACTIONS;DENIES/REPORTS:21021675::"Denies"} Difficulty using hands for taps, buttons, cutlery, and/or writing: {ACTIONS;DENIES/REPORTS:21021675::"Denies"}  No Rheumatology ROS completed.   PMFS History:  Patient Active Problem List   Diagnosis Date Noted   Leukocytoclastic vasculitis (HCC) 09/16/2016   Alcohol use 09/13/2016   Vasculitis on skin biopsy 07/02/2016   Episcleritis of both eyes 07/02/2016   High risk medication use 07/02/2016   History of DVT (deep vein thrombosis) 07/02/2016    Past Medical History:  Diagnosis Date   Allergy    SEASONAL   Bone spur of left foot    DVT (deep venous thrombosis) (HCC) 2012   following casting of leg, noted on chart review   Dysmenorrhea    Episcleritis of both eyes    Fibroid    H/O vasculitis    in legs   Scleritis     Family History  Adopted: Yes  Problem Relation Age of Onset   Diabetes Mother    Asthma Mother    Breast cancer Mother    Diabetes Maternal Grandmother    Asthma Maternal Grandmother    Diabetes Maternal Grandfather    Colon cancer Neg Hx    Colon polyps Neg Hx    Crohn's disease Neg Hx    Esophageal cancer Neg Hx    Rectal cancer Neg Hx    Stomach cancer Neg Hx    Ulcerative colitis Neg Hx     Past Surgical History:  Procedure Laterality Date   broken foot Right    COLONOSCOPY W/ POLYPECTOMY  12/2022   fracture of index finger Left    Social History   Social History Narrative   Not on file   Immunization History  Administered Date(s) Administered   Moderna Sars-Covid-2 Vaccination 12/31/2019, 01/14/2020   Tdap 12/11/2021     Objective: Vital Signs: LMP 11/20/2021    Physical Exam   Musculoskeletal Exam: ***  CDAI Exam: CDAI Score: -- Patient Global: --; Provider Global: -- Swollen: --; Tender: -- Joint Exam 08/21/2023   No joint exam has been documented for this visit   There is currently no information documented on the homunculus. Go to the Rheumatology activity and complete the homunculus joint exam.  Investigation: No additional findings.  Imaging: No results found.  Recent Labs: Lab Results  Component Value Date   WBC 2.7 (L) 03/19/2023   HGB 11.4 (L) 03/19/2023   PLT 132 (L) 03/19/2023   NA 142 03/19/2023   K 3.8 03/19/2023   CL 106 03/19/2023   CO2 26 03/19/2023   GLUCOSE 104 (H) 03/19/2023   BUN 14 03/19/2023   CREATININE 0.66 03/19/2023   BILITOT 0.3 03/19/2023   ALKPHOS 61 11/04/2022   AST 14 03/19/2023   ALT 23 03/19/2023   PROT 7.0 03/19/2023   ALBUMIN 4.3 11/04/2022  CALCIUM 8.9 03/19/2023   GFRAA 128 01/03/2020   QFTBGOLDPLUS NEGATIVE 05/18/2021    Speciality Comments: [id number], Ttd with MTXx 6 tabs weekly X  78yr.  Procedures:  No procedures performed Allergies: Patient has no known allergies.   Assessment / Plan:     Visit Diagnoses: Vasculitis on skin biopsy  Episcleritis of both eyes  High risk medication use  Elevated CK  History of DVT (deep vein thrombosis)  Seasonal allergic rhinitis due to other allergic trigger  Alcohol use  Former smoker  Orders: No orders of the defined types were placed in this encounter.  No orders of the defined types were placed in this encounter.   Face-to-face  time spent with patient was *** minutes. Greater than 50% of time was spent in counseling and coordination of care.  Follow-Up Instructions: No follow-ups on file.   Gearldine Bienenstock, PA-C  Note - This record has been created using Dragon software.  Chart creation errors have been sought, but may not always  have been located. Such creation errors do not reflect on  the standard of medical care.

## 2023-08-21 ENCOUNTER — Ambulatory Visit: Payer: Managed Care, Other (non HMO) | Admitting: Physician Assistant

## 2023-08-21 DIAGNOSIS — J3089 Other allergic rhinitis: Secondary | ICD-10-CM

## 2023-08-21 DIAGNOSIS — L959 Vasculitis limited to the skin, unspecified: Secondary | ICD-10-CM

## 2023-08-21 DIAGNOSIS — H15103 Unspecified episcleritis, bilateral: Secondary | ICD-10-CM

## 2023-08-21 DIAGNOSIS — Z87891 Personal history of nicotine dependence: Secondary | ICD-10-CM

## 2023-08-21 DIAGNOSIS — Z789 Other specified health status: Secondary | ICD-10-CM

## 2023-08-21 DIAGNOSIS — Z79899 Other long term (current) drug therapy: Secondary | ICD-10-CM

## 2023-08-21 DIAGNOSIS — R748 Abnormal levels of other serum enzymes: Secondary | ICD-10-CM

## 2023-08-21 DIAGNOSIS — Z86718 Personal history of other venous thrombosis and embolism: Secondary | ICD-10-CM

## 2023-09-26 ENCOUNTER — Ambulatory Visit: Payer: Managed Care, Other (non HMO)

## 2023-10-15 ENCOUNTER — Other Ambulatory Visit: Payer: Self-pay | Admitting: Emergency Medicine

## 2023-10-24 ENCOUNTER — Ambulatory Visit: Payer: Managed Care, Other (non HMO)

## 2023-11-17 ENCOUNTER — Ambulatory Visit: Payer: Managed Care, Other (non HMO)

## 2024-03-24 ENCOUNTER — Ambulatory Visit: Payer: Self-pay | Admitting: Emergency Medicine

## 2024-03-24 ENCOUNTER — Inpatient Hospital Stay
Admission: RE | Admit: 2024-03-24 | Discharge: 2024-03-24 | Discharge status: Home / Self Care | Payer: Self-pay | Source: Ambulatory Visit | Attending: Obstetrics and Gynecology | Admitting: Obstetrics and Gynecology

## 2024-03-24 ENCOUNTER — Encounter: Payer: Self-pay | Admitting: Emergency Medicine

## 2024-03-24 ENCOUNTER — Ambulatory Visit (INDEPENDENT_AMBULATORY_CARE_PROVIDER_SITE_OTHER): Payer: Self-pay | Admitting: Emergency Medicine

## 2024-03-24 VITALS — BP 130/84 | HR 78 | Temp 98.6°F | Ht 68.0 in | Wt 220.0 lb

## 2024-03-24 DIAGNOSIS — L989 Disorder of the skin and subcutaneous tissue, unspecified: Secondary | ICD-10-CM | POA: Diagnosis not present

## 2024-03-24 DIAGNOSIS — Z0001 Encounter for general adult medical examination with abnormal findings: Secondary | ICD-10-CM

## 2024-03-24 DIAGNOSIS — Z130 Encounter for screening for diseases of the blood and blood-forming organs and certain disorders involving the immune mechanism: Secondary | ICD-10-CM | POA: Diagnosis not present

## 2024-03-24 DIAGNOSIS — Z1329 Encounter for screening for other suspected endocrine disorder: Secondary | ICD-10-CM

## 2024-03-24 DIAGNOSIS — N951 Menopausal and female climacteric states: Secondary | ICD-10-CM | POA: Diagnosis not present

## 2024-03-24 DIAGNOSIS — Z13228 Encounter for screening for other metabolic disorders: Secondary | ICD-10-CM

## 2024-03-24 DIAGNOSIS — Z13220 Encounter for screening for lipoid disorders: Secondary | ICD-10-CM

## 2024-03-24 DIAGNOSIS — Z0000 Encounter for general adult medical examination without abnormal findings: Secondary | ICD-10-CM

## 2024-03-24 LAB — COMPREHENSIVE METABOLIC PANEL WITH GFR
ALT: 12 U/L (ref 0–35)
AST: 14 U/L (ref 0–37)
Albumin: 4.5 g/dL (ref 3.5–5.2)
Alkaline Phosphatase: 64 U/L (ref 39–117)
BUN: 12 mg/dL (ref 6–23)
CO2: 29 meq/L (ref 19–32)
Calcium: 9.8 mg/dL (ref 8.4–10.5)
Chloride: 102 meq/L (ref 96–112)
Creatinine, Ser: 0.61 mg/dL (ref 0.40–1.20)
GFR: 103.22 mL/min (ref >60.00)
Glucose, Bld: 101 mg/dL — ABNORMAL HIGH (ref 70–99)
Potassium: 3.7 meq/L (ref 3.5–5.1)
Sodium: 139 meq/L (ref 135–145)
Total Bilirubin: 0.4 mg/dL (ref 0.2–1.2)
Total Protein: 8.2 g/dL (ref 6.0–8.3)

## 2024-03-24 LAB — CBC WITH DIFFERENTIAL/PLATELET
Basophils Absolute: 0 K/uL (ref 0.0–0.1)
Basophils Relative: 0.8 % (ref 0.0–3.0)
Eosinophils Absolute: 0.1 K/uL (ref 0.0–0.7)
Eosinophils Relative: 3 % (ref 0.0–5.0)
HCT: 36.7 % (ref 36.0–46.0)
Hemoglobin: 12.2 g/dL (ref 12.0–15.0)
Lymphocytes Relative: 44.4 % (ref 12.0–46.0)
Lymphs Abs: 1.1 K/uL (ref 0.7–4.0)
MCHC: 33.4 g/dL (ref 30.0–36.0)
MCV: 83.3 fl (ref 78.0–100.0)
Monocytes Absolute: 0.1 K/uL (ref 0.1–1.0)
Monocytes Relative: 3.6 % (ref 3.0–12.0)
Neutro Abs: 1.2 K/uL — ABNORMAL LOW (ref 1.4–7.7)
Neutrophils Relative %: 48.2 % (ref 43.0–77.0)
Platelets: 159 K/uL (ref 150.0–400.0)
RBC: 4.4 Mil/uL (ref 3.87–5.11)
RDW: 14.4 % (ref 11.5–15.5)
WBC: 2.6 K/uL — ABNORMAL LOW (ref 4.0–10.5)

## 2024-03-24 LAB — LIPID PANEL
Cholesterol: 160 mg/dL (ref 0–200)
HDL: 64 mg/dL (ref >39.00)
LDL Cholesterol: 85 mg/dL (ref 0–99)
NonHDL: 95.82
Total CHOL/HDL Ratio: 2
Triglycerides: 55 mg/dL (ref 0.0–149.0)
VLDL: 11 mg/dL (ref 0.0–40.0)

## 2024-03-24 LAB — TSH: TSH: 0.39 u[IU]/mL (ref 0.35–5.50)

## 2024-03-24 LAB — VITAMIN D 25 HYDROXY (VIT D DEFICIENCY, FRACTURES): VITD: 48.2 ng/mL (ref 30.00–100.00)

## 2024-03-24 LAB — HEMOGLOBIN A1C: Hgb A1c MFr Bld: 6.1 % (ref 4.6–6.5)

## 2024-03-24 LAB — VITAMIN B12: Vitamin B-12: 549 pg/mL (ref 211–911)

## 2024-03-24 MED ORDER — VEOZAH 45 MG PO TABS: 45.0000 mg | ORAL_TABLET | Freq: Every day | ORAL | 3 refills | Status: AC

## 2024-03-24 MED ORDER — MONTELUKAST SODIUM 10 MG PO TABS: 10.0000 mg | ORAL_TABLET | Freq: Every day | ORAL | 0 refills | Status: DC

## 2024-03-24 NOTE — Assessment & Plan Note (Signed)
 Hyperpigmented round lesion Needs dermatology evaluation Referral placed today

## 2024-03-24 NOTE — Patient Instructions (Signed)

## 2024-03-24 NOTE — Progress Notes (Signed)
 Joyce Zhang 53 y.o.   Chief Complaint  Patient presents with   Annual Exam    Patient here for physical. She would like her mole on his looked at options for getting it removed.     HISTORY OF PRESENT ILLNESS: This is a 53 y.o. female here for annual exam. Also complaining of severe hot flashes due to menopause. Also wants referral to dermatology for skin lesion in her nose No other complaints or medical concerns today.  HPI   Prior to Admission medications   Medication Sig Start Date End Date Taking? Authorizing Provider  B Complex Vitamins (B COMPLEX PO) Take by mouth daily.   Yes [provider]  cetirizine  (ZYRTEC  ALLERGY) 10 MG tablet Take 1 tablet (10 mg total) by mouth daily. 01/24/21  Yes Georgina Speaks, FNP  Fezolinetant  (VEOZAH ) 45 MG TABS Take 1 tablet (45 mg total) by mouth daily. 03/24/24  Yes Abrahan Fulmore, Emil Schanz, MD  Multiple Vitamins-Minerals (ONE-A-DAY WOMENS PO) Take by mouth daily.   Yes [provider]  OVER THE COUNTER MEDICATION daily. Sea moss   Yes [provider]  montelukast  (SINGULAIR ) 10 MG tablet Take 1 tablet (10 mg total) by mouth daily. 03/24/24   [facility] Emil Schanz, MD    No Known Allergies  Patient Active Problem List   Diagnosis Date Noted   Leukocytoclastic vasculitis (HCC) 09/16/2016   Alcohol use 09/13/2016   High risk medication use 07/02/2016   History of DVT (deep vein thrombosis) 07/02/2016    Past Medical History:  Diagnosis Date   Allergy    SEASONAL   Bone spur of left foot    DVT (deep venous thrombosis) (HCC) 2012   following casting of leg, noted on chart review   Dysmenorrhea    Episcleritis of both eyes    Fibroid    H/O vasculitis    in legs   Scleritis     Past Surgical History:  Procedure Laterality Date   broken foot Right    COLONOSCOPY W/ POLYPECTOMY  12/2022   fracture of index finger Left     Social History   Socioeconomic History   Marital status: Single     Spouse name: Not on file   Number of children: Not on file   Years of education: Not on file   Highest education level: Master's degree (e.g., MA, MS, MEng, MEd, MSW, MBA)  Occupational History   Not on file  Tobacco Use   Smoking status: Former    Current packs/day: 0.00    Types: Cigarettes    Quit date: 10/25/2018    Years since quitting: 5.4    Passive exposure: Past   Smokeless tobacco: Never  Vaping Use   Vaping status: Never Used  Substance and Sexual Activity   Alcohol use: Yes    Alcohol/week: 3.0 standard drinks of alcohol    Types: 3 Standard drinks or equivalent per week    Comment: socially   Drug use: No   Sexual activity: Yes    Partners: Female    Birth control/protection: None  Other Topics Concern   Not on file  Social History Narrative   Not on file   Social Drivers of Health   Financial Resource Strain: Low Risk  (03/23/2024)   Overall Financial Resource Strain (CARDIA)    Difficulty of Paying Living Expenses: Not hard at all  Food Insecurity: No Food Insecurity (03/23/2024)   Hunger Vital Sign    Worried About Running Out of  Food in the Last Year: Never true    Ran Out of Food in the Last Year: Never true  Transportation Needs: No Transportation Needs (03/23/2024)   PRAPARE - Administrator, Civil Service (Medical): No    Lack of Transportation (Non-Medical): No  Physical Activity: Insufficiently Active (03/23/2024)   Exercise Vital Sign    Days of Exercise per Week: 3 days    Minutes of Exercise per Session: 30 min  Stress: No Stress Concern Present (03/23/2024)   [employer] of Occupational Health - Occupational Stress Questionnaire    Feeling of Stress: Not at all  Social Connections: Socially Isolated (03/23/2024)   Social Connection and Isolation Panel    Frequency of Communication with Friends and Family: Once a week    Frequency of Social Gatherings with Friends and Family: Once a week    Attends Religious Services: Never     Database administrator or Organizations: No    Attends Engineer, structural: Not on file    Marital Status: Never married  Intimate Partner Violence: Unknown (11/27/2021)   Received from [facility]   HITS    Physically Hurt: Not on file    Insult or Talk Down To: Not on file    Threaten Physical Harm: Not on file    Scream or Curse: Not on file    Family History  Adopted: Yes  Problem Relation Age of Onset   Diabetes Mother    Asthma Mother    Breast cancer Mother    Diabetes Maternal Grandmother    Asthma Maternal Grandmother    Diabetes Maternal Grandfather    Colon cancer Neg Hx    Colon polyps Neg Hx    Crohn's disease Neg Hx    Esophageal cancer Neg Hx    Rectal cancer Neg Hx    Stomach cancer Neg Hx    Ulcerative colitis Neg Hx      Review of Systems  Constitutional: Negative.  Negative for chills and fever.  HENT: Negative.  Negative for congestion and sore throat.   Respiratory: Negative.  Negative for cough and shortness of breath.   Cardiovascular:  Negative for chest pain and palpitations.  Gastrointestinal:  Negative for abdominal pain, nausea and vomiting.  Genitourinary: Negative.  Negative for dysuria and hematuria.  Skin: Negative.  Negative for rash.  Neurological:  Negative for dizziness and headaches.  Endo/Heme/Allergies:        Hot flashes  All other systems reviewed and are negative.   Vitals:   03/24/24 1052  BP: 130/84  Pulse: 78  Temp: 98.6 F (37 C)  SpO2: 97%    Physical Exam Vitals reviewed.  Constitutional:      Appearance: Normal appearance.  HENT:     Head: Normocephalic.     Right Ear: Tympanic membrane, ear canal and external ear normal.     Left Ear: Tympanic membrane, ear canal and external ear normal.     Mouth/Throat:     Mouth: Mucous membranes are moist.     Pharynx: Oropharynx is clear.  Eyes:     Extraocular Movements: Extraocular movements intact.     Conjunctiva/sclera: Conjunctivae normal.      Pupils: Pupils are equal, round, and reactive to light.  Cardiovascular:     Rate and Rhythm: Normal rate and regular rhythm.     Pulses: Normal pulses.     Heart sounds: Normal heart sounds.  Pulmonary:     Effort: Pulmonary effort  is normal.     Breath sounds: Normal breath sounds.  Abdominal:     Palpations: Abdomen is soft.     Tenderness: There is no abdominal tenderness.  Musculoskeletal:     Cervical back: No tenderness.  Lymphadenopathy:     Cervical: No cervical adenopathy.  Skin:    General: Skin is warm and dry.     Capillary Refill: Capillary refill takes less than 2 seconds.     Findings: Lesion (Black round lesion in her nose) present.  Neurological:     General: No focal deficit present.     Mental Status: She is alert and oriented to person, place, and time.  Psychiatric:        Mood and Affect: Mood normal.        Behavior: Behavior normal.         ASSESSMENT & PLAN: Problem List Items Addressed This Visit       Musculoskeletal and Integument   Skin lesion of face   Hyperpigmented round lesion Needs dermatology evaluation Referral placed today      Relevant Orders   Ambulatory referral to Dermatology     Other   Vasomotor symptoms due to menopause   Very active and affecting quality of life Needs follow-up with gynecologist In the meantime recommend Veozah  45 mg daily      Relevant Medications   Fezolinetant  (VEOZAH ) 45 MG TABS   Other Visit Diagnoses       Encounter for general adult medical examination with abnormal findings    -  Primary   Relevant Orders   CBC with Differential/Platelet   Comprehensive metabolic panel with GFR   Hemoglobin A1c   Lipid panel   Vitamin B12   VITAMIN D  25 Hydroxy (Vit-D Deficiency, Fractures)   TSH     Screening for deficiency anemia       Relevant Orders   CBC with Differential/Platelet     Screening for lipoid disorders       Relevant Orders   Lipid panel     Screening for endocrine,  metabolic and immunity disorder       Relevant Orders   Comprehensive metabolic panel with GFR   Hemoglobin A1c   Vitamin B12   VITAMIN D  25 Hydroxy (Vit-D Deficiency, Fractures)   TSH     Modifiable risk factors discussed with patient. Anticipatory guidance according to age provided. The following topics were also discussed: Social Determinants of Health Smoking.  Former smoker Diet and nutrition Benefits of exercise Cancer screening and review of most recent colonoscopy and mammogram reports Vaccinations review and recommendations Cardiovascular risk assessment and need for blood work Mental health including depression and anxiety Fall and accident prevention  Patient Instructions  Health Maintenance, Female Adopting a healthy lifestyle and getting preventive care are important in promoting health and wellness. Ask your health care provider about: The right schedule for you to have regular tests and exams. Things you can do on your own to prevent diseases and keep yourself healthy. What should I know about diet, weight, and exercise? Eat a healthy diet  Eat a diet that includes plenty of vegetables, fruits, low-fat dairy products, and lean protein. Do not eat a lot of foods that are high in solid fats, added sugars, or sodium. Maintain a healthy weight Body mass index (BMI) is used to identify weight problems. It estimates body fat based on height and weight. Your health care provider can help determine your BMI and help you achieve  or maintain a healthy weight. Get regular exercise Get regular exercise. This is one of the most important things you can do for your health. Most adults should: Exercise for at least 150 minutes each week. The exercise should increase your heart rate and make you sweat (moderate-intensity exercise). Do strengthening exercises at least twice a week. This is in addition to the moderate-intensity exercise. Spend less time sitting. Even light physical  activity can be beneficial. Watch cholesterol and blood lipids Have your blood tested for lipids and cholesterol at 52 years of age, then have this test every 5 years. Have your cholesterol levels checked more often if: Your lipid or cholesterol levels are high. You are older than 52 years of age. You are at high risk for heart disease. What should I know about cancer screening? Depending on your health history and family history, you may need to have cancer screening at various ages. This may include screening for: Breast cancer. Cervical cancer. Colorectal cancer. Skin cancer. Lung cancer. What should I know about heart disease, diabetes, and high blood pressure? Blood pressure and heart disease High blood pressure causes heart disease and increases the risk of stroke. This is more likely to develop in people who have high blood pressure readings or are overweight. Have your blood pressure checked: Every 3-5 years if you are 94-20 years of age. Every year if you are 43 years old or older. Diabetes Have regular diabetes screenings. This checks your fasting blood sugar level. Have the screening done: Once every three years after age 37 if you are at a normal weight and have a low risk for diabetes. More often and at a younger age if you are overweight or have a high risk for diabetes. What should I know about preventing infection? Hepatitis B If you have a higher risk for hepatitis B, you should be screened for this virus. Talk with your health care provider to find out if you are at risk for hepatitis B infection. Hepatitis C Testing is recommended for: Everyone born from [birth year] through 1964. Anyone with known risk factors for hepatitis C. Sexually transmitted infections (STIs) Get screened for STIs, including gonorrhea and chlamydia, if: You are sexually active and are younger than 52 years of age. You are older than 52 years of age and your health care provider tells you that you  are at risk for this type of infection. Your sexual activity has changed since you were last screened, and you are at increased risk for chlamydia or gonorrhea. Ask your health care provider if you are at risk. Ask your health care provider about whether you are at high risk for HIV. Your health care provider may recommend a prescription medicine to help prevent HIV infection. If you choose to take medicine to prevent HIV, you should first get tested for HIV. You should then be tested every 3 months for as long as you are taking the medicine. Pregnancy If you are about to stop having your period (premenopausal) and you may become pregnant, seek counseling before you get pregnant. Take 400 to 800 micrograms (mcg) of folic acid  every day if you become pregnant. Ask for birth control (contraception) if you want to prevent pregnancy. Osteoporosis and menopause Osteoporosis is a disease in which the bones lose minerals and strength with aging. This can result in bone fractures. If you are 43 years old or older, or if you are at risk for osteoporosis and fractures, ask your health care provider if you  should: Be screened for bone loss. Take a calcium or vitamin D  supplement to lower your risk of fractures. Be given hormone replacement therapy (HRT) to treat symptoms of menopause. Follow these instructions at home: Alcohol use Do not drink alcohol if: Your health care provider tells you not to drink. You are pregnant, may be pregnant, or are planning to become pregnant. If you drink alcohol: Limit how much you have to: 0-1 drink a day. Know how much alcohol is in your drink. In the [place] one drink equals one 12 oz bottle of beer (355 mL), one 5 oz glass of wine (148 mL), or one 1 oz glass of hard liquor (44 mL). Lifestyle Do not use any products that contain nicotine or tobacco. These products include cigarettes, chewing tobacco, and vaping devices, such as e-cigarettes. If you need help quitting,  ask your health care provider. Do not use street drugs. Do not share needles. Ask your health care provider for help if you need support or information about quitting drugs. General instructions Schedule regular health, dental, and eye exams. Stay current with your vaccines. Tell your health care provider if: You often feel depressed. You have ever been abused or do not feel safe at home. Summary Adopting a healthy lifestyle and getting preventive care are important in promoting health and wellness. Follow your health care provider's instructions about healthy diet, exercising, and getting tested or screened for diseases. Follow your health care provider's instructions on monitoring your cholesterol and blood pressure. This information is not intended to replace advice given to you by your health care provider. Make sure you discuss any questions you have with your health care provider. Document Revised: 01/01/2021 Document Reviewed: 01/01/2021 Elsevier Patient Education  2024 Elsevier Inc.      Emil Schaumann, MD [facility]

## 2024-03-24 NOTE — Assessment & Plan Note (Signed)
 Very active and affecting quality of life Needs follow-up with gynecologist In the meantime recommend Veozah  45 mg daily

## 2024-03-26 NOTE — Telephone Encounter (Signed)
 Refer to hematology for evaluation of chronically low white blood cell count.  Also look into her prescriptions to see what she needs.  Thanks.

## 2024-03-28 ENCOUNTER — Ambulatory Visit: Payer: Self-pay | Admitting: Obstetrics and Gynecology

## 2024-03-29 ENCOUNTER — Other Ambulatory Visit: Payer: Self-pay | Admitting: Obstetrics and Gynecology

## 2024-03-29 DIAGNOSIS — R928 Other abnormal and inconclusive findings on diagnostic imaging of breast: Secondary | ICD-10-CM

## 2024-04-07 ENCOUNTER — Ambulatory Visit
Admission: RE | Admit: 2024-04-07 | Discharge: 2024-04-07 | Disposition: A | Discharge status: Home / Self Care | Source: Ambulatory Visit | Attending: Obstetrics and Gynecology | Admitting: Obstetrics and Gynecology

## 2024-04-07 DIAGNOSIS — R928 Other abnormal and inconclusive findings on diagnostic imaging of breast: Secondary | ICD-10-CM

## 2024-04-08 ENCOUNTER — Ambulatory Visit: Payer: Self-pay | Admitting: Obstetrics and Gynecology

## 2024-06-29 ENCOUNTER — Other Ambulatory Visit: Payer: Self-pay | Admitting: Emergency Medicine

## 2024-09-13 ENCOUNTER — Ambulatory Visit: Admitting: Emergency Medicine

## 2024-11-04 ENCOUNTER — Ambulatory Visit: Admitting: Dermatology
